# Patient Record
Sex: Female | Born: 1950 | Race: White | Hispanic: No | Smoking: Former smoker
Health system: Southern US, Community
[De-identification: ages and names within clinical notes are randomized; demographics above are authoritative.]

## PROBLEM LIST (undated history)

## (undated) DIAGNOSIS — M8580 Other specified disorders of bone density and structure, unspecified site: Secondary | ICD-10-CM

## (undated) DIAGNOSIS — G4730 Sleep apnea, unspecified: Secondary | ICD-10-CM

## (undated) DIAGNOSIS — E039 Hypothyroidism, unspecified: Secondary | ICD-10-CM

## (undated) DIAGNOSIS — Z862 Personal history of diseases of the blood and blood-forming organs and certain disorders involving the immune mechanism: Secondary | ICD-10-CM

## (undated) DIAGNOSIS — F419 Anxiety disorder, unspecified: Secondary | ICD-10-CM

## (undated) DIAGNOSIS — M1990 Unspecified osteoarthritis, unspecified site: Secondary | ICD-10-CM

## (undated) DIAGNOSIS — K5900 Constipation, unspecified: Secondary | ICD-10-CM

## (undated) DIAGNOSIS — Z87442 Personal history of urinary calculi: Secondary | ICD-10-CM

## (undated) DIAGNOSIS — Z85828 Personal history of other malignant neoplasm of skin: Secondary | ICD-10-CM

## (undated) DIAGNOSIS — K219 Gastro-esophageal reflux disease without esophagitis: Secondary | ICD-10-CM

## (undated) DIAGNOSIS — E7800 Pure hypercholesterolemia, unspecified: Secondary | ICD-10-CM

## (undated) DIAGNOSIS — I10 Essential (primary) hypertension: Secondary | ICD-10-CM

## (undated) HISTORY — PX: TONSILLECTOMY: SUR1361

## (undated) HISTORY — PX: CARPAL TUNNEL RELEASE: SHX101

---
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
## 1979-08-06 HISTORY — PX: TUBAL LIGATION: SHX77

## 1997-08-05 HISTORY — PX: MENISCUS REPAIR: SHX5179

## 1997-11-15 ENCOUNTER — Other Ambulatory Visit: Admission: RE | Admit: 1997-11-15 | Discharge: 1997-11-15 | Payer: Self-pay | Admitting: Obstetrics and Gynecology

## 1998-07-13 ENCOUNTER — Ambulatory Visit (HOSPITAL_BASED_OUTPATIENT_CLINIC_OR_DEPARTMENT_OTHER): Admission: RE | Admit: 1998-07-13 | Discharge: 1998-07-13 | Payer: Self-pay | Admitting: Orthopedic Surgery

## 1998-11-27 ENCOUNTER — Other Ambulatory Visit: Admission: RE | Admit: 1998-11-27 | Discharge: 1998-11-27 | Payer: Self-pay | Admitting: Obstetrics & Gynecology

## 1999-05-29 ENCOUNTER — Other Ambulatory Visit: Admission: RE | Admit: 1999-05-29 | Discharge: 1999-05-29 | Payer: Self-pay | Admitting: Obstetrics & Gynecology

## 1999-05-29 ENCOUNTER — Encounter (INDEPENDENT_AMBULATORY_CARE_PROVIDER_SITE_OTHER): Payer: Self-pay

## 1999-11-20 ENCOUNTER — Other Ambulatory Visit: Admission: RE | Admit: 1999-11-20 | Discharge: 1999-11-20 | Payer: Self-pay | Admitting: Obstetrics & Gynecology

## 1999-12-14 ENCOUNTER — Encounter: Admission: RE | Admit: 1999-12-14 | Discharge: 1999-12-14 | Payer: Self-pay | Admitting: Obstetrics & Gynecology

## 1999-12-14 ENCOUNTER — Encounter: Payer: Self-pay | Admitting: Obstetrics & Gynecology

## 2000-01-24 ENCOUNTER — Ambulatory Visit (HOSPITAL_COMMUNITY): Admission: RE | Admit: 2000-01-24 | Discharge: 2000-01-24 | Payer: Self-pay | Admitting: Family Medicine

## 2000-03-12 ENCOUNTER — Other Ambulatory Visit: Admission: RE | Admit: 2000-03-12 | Discharge: 2000-03-12 | Payer: Self-pay | Admitting: General Surgery

## 2000-11-21 ENCOUNTER — Other Ambulatory Visit: Admission: RE | Admit: 2000-11-21 | Discharge: 2000-11-21 | Payer: Self-pay | Admitting: Obstetrics & Gynecology

## 2000-12-23 ENCOUNTER — Encounter: Payer: Self-pay | Admitting: Obstetrics & Gynecology

## 2000-12-23 ENCOUNTER — Encounter: Admission: RE | Admit: 2000-12-23 | Discharge: 2000-12-23 | Payer: Self-pay | Admitting: Obstetrics & Gynecology

## 2001-11-30 ENCOUNTER — Other Ambulatory Visit: Admission: RE | Admit: 2001-11-30 | Discharge: 2001-11-30 | Payer: Self-pay | Admitting: Obstetrics & Gynecology

## 2002-03-26 ENCOUNTER — Encounter: Payer: Self-pay | Admitting: Orthopedic Surgery

## 2002-03-26 ENCOUNTER — Encounter: Admission: RE | Admit: 2002-03-26 | Discharge: 2002-03-26 | Payer: Self-pay | Admitting: Orthopedic Surgery

## 2002-05-08 ENCOUNTER — Encounter: Admission: RE | Admit: 2002-05-08 | Discharge: 2002-05-08 | Payer: Self-pay | Admitting: Orthopedic Surgery

## 2002-05-08 ENCOUNTER — Encounter: Payer: Self-pay | Admitting: Orthopedic Surgery

## 2003-02-09 ENCOUNTER — Other Ambulatory Visit: Admission: RE | Admit: 2003-02-09 | Discharge: 2003-02-09 | Payer: Self-pay | Admitting: Obstetrics & Gynecology

## 2003-04-15 ENCOUNTER — Ambulatory Visit (HOSPITAL_COMMUNITY): Admission: RE | Admit: 2003-04-15 | Discharge: 2003-04-15 | Payer: Self-pay | Admitting: Orthopedic Surgery

## 2003-04-15 ENCOUNTER — Encounter: Payer: Self-pay | Admitting: Orthopedic Surgery

## 2005-04-15 ENCOUNTER — Encounter (INDEPENDENT_AMBULATORY_CARE_PROVIDER_SITE_OTHER): Payer: Self-pay | Admitting: Specialist

## 2005-04-15 ENCOUNTER — Ambulatory Visit (HOSPITAL_COMMUNITY): Admission: RE | Admit: 2005-04-15 | Discharge: 2005-04-15 | Payer: Self-pay | Admitting: Gastroenterology

## 2006-01-30 ENCOUNTER — Encounter: Admission: RE | Admit: 2006-01-30 | Discharge: 2006-01-30 | Payer: Self-pay | Admitting: Family Medicine

## 2007-07-22 ENCOUNTER — Ambulatory Visit (HOSPITAL_COMMUNITY): Admission: RE | Admit: 2007-07-22 | Discharge: 2007-07-22 | Payer: Self-pay | Admitting: Obstetrics & Gynecology

## 2008-08-17 ENCOUNTER — Ambulatory Visit (HOSPITAL_COMMUNITY): Admission: RE | Admit: 2008-08-17 | Discharge: 2008-08-17 | Payer: Self-pay | Admitting: Obstetrics & Gynecology

## 2009-08-05 HISTORY — PX: BUNIONECTOMY: SHX129

## 2009-08-18 ENCOUNTER — Ambulatory Visit (HOSPITAL_COMMUNITY): Admission: RE | Admit: 2009-08-18 | Discharge: 2009-08-18 | Payer: Self-pay | Admitting: Obstetrics & Gynecology

## 2009-08-23 ENCOUNTER — Encounter: Admission: RE | Admit: 2009-08-23 | Discharge: 2009-08-23 | Payer: Self-pay | Admitting: Obstetrics & Gynecology

## 2009-11-17 ENCOUNTER — Encounter: Admission: RE | Admit: 2009-11-17 | Discharge: 2009-11-17 | Payer: Self-pay | Admitting: Obstetrics & Gynecology

## 2010-11-20 ENCOUNTER — Other Ambulatory Visit: Payer: Self-pay | Admitting: Family Medicine

## 2010-11-20 DIAGNOSIS — Z1231 Encounter for screening mammogram for malignant neoplasm of breast: Secondary | ICD-10-CM

## 2010-11-27 ENCOUNTER — Ambulatory Visit
Admission: RE | Admit: 2010-11-27 | Discharge: 2010-11-27 | Disposition: A | Discharge status: Home / Self Care | Payer: BC Managed Care – PPO | Source: Ambulatory Visit | Attending: Family Medicine | Admitting: Family Medicine

## 2010-11-27 DIAGNOSIS — Z1231 Encounter for screening mammogram for malignant neoplasm of breast: Secondary | ICD-10-CM

## 2010-12-14 ENCOUNTER — Other Ambulatory Visit: Payer: Self-pay | Admitting: Obstetrics & Gynecology

## 2010-12-21 NOTE — Procedures (Signed)
[place]  Patient:    Jamie Espinoza, Jamie Espinoza                         MRN: [id number] Proc. Date: 01/24/00 Adm. Date:  [id number] Disc. Date: [id number] Attending:  Charna Elizabeth CC:         Talmadge Coventry, M.D.                           Procedure Report  DATE OF BIRTH:  02-07-1951  REFERRING PHYSICIAN:  Talmadge Coventry, M.D.  PROCEDURE:  Colonoscopy.  ENDOSCOPIST:  Anselmo Rod, M.D.  INSTRUMENTS USED:  Olympus video colonoscope.  INDICATIONS:  Rectal bleeding with personal history of polyps and a family history of colon cancer in a 60 year old white female.  Rule out colonic polyps, masses, hemorrhoids, etc.  PREPROCEDURE PREPARATION:  Informed consent was procured from the patient. The patient was fasted for eight hours prior to the procedure and prepped with a bottle of magnesium citrate and a gallon of NuLytely the night prior to the procedure.  PREPROCEDURE PHYSICAL EXAMINATION:  VITAL SIGNS:  The patient had stable vital signs.  NECK:  Supple.  CHEST:  Clear to auscultation, S1, S2 regular.  ABDOMEN:  Soft with normal abdominal bowel sounds.  DESCRIPTION OF PROCEDURE:  The patient was placed in the left lateral decubitus position and sedated with 80 mg of Demerol and 2 mg of Versed intravenously.  Once the patient was adequately sedated and maintained on low-flow oxygen and continuous cardiac monitoring, the Olympus video colonoscope was advanced from the rectum to the cecum without difficulty. Except for some residual stool in the dependent areas of the colon, no abnormalities were seen up to the cecum.  The appendicial orifice and the ileocecal valve were clearly visualized.  No masses, polyps, erosions, ulcerations, or diverticular were present.  There were prominent external hemorrhoids and small internal hemorrhoids that were nonbleeding at the time of examination.  The patient tolerated the procedure well  without complications.  No masses or polyps were seen.  IMPRESSION:  Essentially normal colonoscopy except for a prominent external hemorrhoids and small internal hemorrhoid.  RECOMMENDATIONS: 1. The patient has been advised to increase her fluids and fiber in her diet. 2. If she has recurrent rectal bleeding she may benefit from a surgical    evaluation and possible surgery for the external hemorrhoids. DD:  01/24/00 TD:  01/26/00 Job: [id number]

## 2010-12-21 NOTE — Op Note (Signed)
NAME:  Jamie Espinoza, Jamie Espinoza NO.:  [id number]   MEDICAL RECORD NO.:  [id number]          PATIENT TYPE:  AMB   LOCATION:  ENDO                         FACILITY:  [facility]   PHYSICIAN:  Anselmo Rod, M.D.  DATE OF BIRTH:  June 26, 1951   DATE OF PROCEDURE:  04/15/2005  DATE OF DISCHARGE:                                 OPERATIVE REPORT   PROCEDURE PERFORMED:  Colonoscopy with snare polypectomy x two.   ENDOSCOPIST:  Charna Elizabeth, M.D.   INSTRUMENT USED:  Olympus video colonoscope.   INDICATIONS FOR PROCEDURE:  60 year old white female with a history of  rectal bleeding and a family history of colon cancer in her father,  undergoing screening colonoscopy to rule out colonic polyps, masses, etc.   PREPROCEDURE PREPARATION:  Informed consent was procured from the patient.  The patient was fasted for eight hours prior to the procedure and prepped  with a bottle of magnesium citrate and a gallon of GoLYTELY the night prior  to the procedure.  The risks and benefits of the procedure including a 10%  miss rate for colon polyps or cancers was discussed with the patient as  well.   PREPROCEDURE PHYSICAL:  The patient had stable vital signs.  Neck supple.  Chest clear to auscultation.  S1 and S2 regular.  Abdomen soft with normal  bowel sounds.   DESCRIPTION OF PROCEDURE:  The patient was placed in left lateral decubitus  position and sedated with 100 mg of Demerol and 10 mg of Versed in slow  incremental doses.  Once the patient was adequately sedated and maintained  on low flow oxygen and continuous cardiac monitoring, the Olympus video  colonoscope was advanced from the rectum to the cecum.  The appendicular  orifice and ileocecal valve were clearly visualized and photographed.  A few  scattered sigmoid diverticula were seen.  Small internal hemorrhoids were  appreciated on retroflexion.  Two small sessile polyps were removed by snare  polypectomy (hot snare one from 75 cm and  one from the distal right colon).  The patient tolerated the procedure well without complication.   IMPRESSION:  1.  Small nonbleeding internal hemorrhoids.  2.  Small sessile polyp snared from 75 cm.  Another small sessile polyp      removed from the distal right colon.  3.  Few scattered sigmoid diverticula.   RECOMMENDATIONS:  1.  Await pathology results.  2.  Avoid all nonsteroidals including aspirin for the next two weeks.  3.  Repeat colonoscopy depending on pathology results.  4.  Outpatient followup as need arises in the future.      Anselmo Rod, M.D.  Electronically Signed     JNM/MEDQ  D:  04/15/2005  T:  04/15/2005  Job:  [id number]   cc:   Talmadge Coventry, M.D.  [address]  [place]  [place] [address]  Fax: [phone]

## 2011-10-22 ENCOUNTER — Other Ambulatory Visit: Payer: Self-pay | Admitting: Family Medicine

## 2011-10-22 DIAGNOSIS — Z1231 Encounter for screening mammogram for malignant neoplasm of breast: Secondary | ICD-10-CM

## 2011-11-29 ENCOUNTER — Ambulatory Visit
Admission: RE | Admit: 2011-11-29 | Discharge: 2011-11-29 | Disposition: A | Discharge status: Home / Self Care | Payer: BC Managed Care – PPO | Source: Ambulatory Visit | Attending: Family Medicine | Admitting: Family Medicine

## 2011-11-29 DIAGNOSIS — Z1231 Encounter for screening mammogram for malignant neoplasm of breast: Secondary | ICD-10-CM

## 2012-11-02 ENCOUNTER — Other Ambulatory Visit: Payer: Self-pay

## 2012-11-02 DIAGNOSIS — Z1231 Encounter for screening mammogram for malignant neoplasm of breast: Secondary | ICD-10-CM

## 2012-12-15 ENCOUNTER — Ambulatory Visit: Payer: BC Managed Care – PPO

## 2012-12-23 ENCOUNTER — Ambulatory Visit
Admission: RE | Admit: 2012-12-23 | Discharge: 2012-12-23 | Disposition: A | Discharge status: Home / Self Care | Payer: BC Managed Care – PPO | Source: Ambulatory Visit

## 2012-12-23 DIAGNOSIS — Z1231 Encounter for screening mammogram for malignant neoplasm of breast: Secondary | ICD-10-CM

## 2013-03-23 ENCOUNTER — Other Ambulatory Visit: Payer: Self-pay | Admitting: Orthopaedic Surgery

## 2013-03-25 NOTE — Pre-Procedure Instructions (Signed)
FELICITE ZEIMET  03/25/2013   Your procedure is scheduled on:  Tuesday April 06, 2013.  Report to [facility] at 5:30 AM.  Call this number if you have problems the morning of surgery: [phone]   Remember:   Do not eat food or drink liquids after midnight.   Take these medicines the morning of surgery with A SIP OF WATER: Synthroid, Wellbutrin, Prilosec, Mucinex (if needed)   STOP Lovaza, Celebrex, Vitamin D, Calcium, Vitamin E, D3, B, CCoQ10, Multiple Vitamin, Asprin  Do not take Aspirin, Aleve, Naproxen, Advil, Ibuprofen, Vitamin, Herbs, or Supplements starting today   Do not wear jewelry  Do not wear lotions, powders, or colognes.  Do not shave 2 days prior to surgery.  Do not bring valuables to the hospital.  [facility] is not responsible for any belongings or valuables.  Contacts, dentures or bridgework may not be worn into surgery.  Leave suitcase in the car. After surgery it may be brought to your room.  For patients admitted to the hospital, checkout time is 11:00 AM the day of discharge.   Patients discharged the day of surgery will not be allowed to drive home.  Name and phone number of your driver: Family/Friend  Special Instructions: Shower using CHG 2 nights before surgery and the night before surgery.  If you shower the day of surgery use CHG.  Use special wash - you have one bottle of CHG for all showers.  You should use approximately 1/3 of the bottle for each shower.   Please read over the following fact sheets that you were given: Pain Booklet, Coughing and Deep Breathing, Blood Transfusion Information, Total Joint Packet, MRSA Information and Surgical Site Infection Prevention

## 2013-03-26 ENCOUNTER — Other Ambulatory Visit (HOSPITAL_COMMUNITY): Payer: BC Managed Care – PPO

## 2013-03-26 ENCOUNTER — Encounter (HOSPITAL_COMMUNITY)
Admission: RE | Admit: 2013-03-26 | Discharge: 2013-03-26 | Disposition: A | Discharge status: Home / Self Care | Payer: BC Managed Care – PPO | Source: Ambulatory Visit | Attending: Orthopaedic Surgery | Admitting: Orthopaedic Surgery

## 2013-03-26 ENCOUNTER — Encounter (HOSPITAL_COMMUNITY): Payer: Self-pay

## 2013-03-26 ENCOUNTER — Ambulatory Visit (HOSPITAL_COMMUNITY)
Admission: RE | Admit: 2013-03-26 | Discharge: 2013-03-26 | Disposition: A | Discharge status: Home / Self Care | Payer: BC Managed Care – PPO | Source: Ambulatory Visit | Attending: Orthopaedic Surgery | Admitting: Orthopaedic Surgery

## 2013-03-26 DIAGNOSIS — Z01812 Encounter for preprocedural laboratory examination: Secondary | ICD-10-CM | POA: Insufficient documentation

## 2013-03-26 DIAGNOSIS — Z01818 Encounter for other preprocedural examination: Secondary | ICD-10-CM | POA: Insufficient documentation

## 2013-03-26 DIAGNOSIS — I10 Essential (primary) hypertension: Secondary | ICD-10-CM | POA: Insufficient documentation

## 2013-03-26 HISTORY — DX: Sleep apnea, unspecified: G47.30

## 2013-03-26 HISTORY — DX: Unspecified osteoarthritis, unspecified site: M19.90

## 2013-03-26 HISTORY — DX: Pure hypercholesterolemia, unspecified: E78.00

## 2013-03-26 HISTORY — DX: Gastro-esophageal reflux disease without esophagitis: K21.9

## 2013-03-26 HISTORY — DX: Anxiety disorder, unspecified: F41.9

## 2013-03-26 HISTORY — DX: Essential (primary) hypertension: I10

## 2013-03-26 HISTORY — DX: Hypothyroidism, unspecified: E03.9

## 2013-03-26 HISTORY — DX: Personal history of diseases of the blood and blood-forming organs and certain disorders involving the immune mechanism: Z86.2

## 2013-03-26 LAB — BASIC METABOLIC PANEL
BUN: 13 mg/dL (ref 6–23)
CO2: 31 mEq/L (ref 19–32)
Chloride: 103 mEq/L (ref 96–112)
Glucose, Bld: 93 mg/dL (ref 70–99)
Potassium: 4 mEq/L (ref 3.5–5.1)
Sodium: 142 mEq/L (ref 135–145)

## 2013-03-26 LAB — CBC WITH DIFFERENTIAL/PLATELET
Eosinophils Absolute: 0.2 10*3/uL (ref 0.0–0.7)
HCT: 38.6 % (ref 36.0–46.0)
Hemoglobin: 13.6 g/dL (ref 12.0–15.0)
Lymphs Abs: 2 10*3/uL (ref 0.7–4.0)
MCH: 30.4 pg (ref 26.0–34.0)
MCV: 86.4 fL (ref 78.0–100.0)
Monocytes Absolute: 0.6 10*3/uL (ref 0.1–1.0)
Monocytes Relative: 10 % (ref 3–12)
Neutrophils Relative %: 55 % (ref 43–77)
RBC: 4.47 MIL/uL (ref 3.87–5.11)

## 2013-03-26 LAB — URINALYSIS, ROUTINE W REFLEX MICROSCOPIC
Ketones, ur: NEGATIVE mg/dL
Nitrite: NEGATIVE
Protein, ur: NEGATIVE mg/dL
pH: 7.5 (ref 5.0–8.0)

## 2013-03-26 LAB — ABO/RH: ABO/RH(D): O POS

## 2013-03-26 LAB — PROTIME-INR: INR: 0.94 (ref 0.00–1.49)

## 2013-03-26 LAB — TYPE AND SCREEN

## 2013-04-02 NOTE — H&P (Signed)
TOTAL KNEE ADMISSION H&P  Patient is being admitted for right total knee arthroplasty.  Subjective:  Chief Complaint:right knee pain.  HPI: Jamie Espinoza, 62 y.o. female, has a history of pain and functional disability in the right knee due to arthritis and has failed non-surgical conservative treatments for greater than 12 weeks to includeNSAID's and/or analgesics, corticosteriod injections, weight reduction as appropriate and activity modification.  Onset of symptoms was gradual, starting 7 years ago with gradually worsening course since that time. The patient noted previous open procedure 1999 on the right knee(s).  Patient currently rates pain in the right knee(s) at 9 out of 10 with activity. Patient has night pain, worsening of pain with activity and weight bearing, pain that interferes with activities of daily living, pain with passive range of motion, crepitus and joint swelling.  Patient has evidence of subchondral sclerosis, periarticular osteophytes and joint space narrowing by imaging studies. This patient has had previous open procedure.. There is no active infection.  There are no active problems to display for this patient.  Past Medical History  Diagnosis Date  . Sleep apnea     uses CPAP  . Hypothyroidism   . Hypertension     Does not see cardiologist  . High cholesterol   . GERD (gastroesophageal reflux disease)   . Anxiety   . Carpal tunnel syndrome   . History of anemia     d/t heavy menstrual bleeding  . DJD (degenerative joint disease)     Past Surgical History  Procedure Laterality Date  . Carpal tunnel release Bilateral     2014  . Bunionectomy  2011    with toe fusion and screw  . Meniscus repair Right 1999  . Right ankle surgery  1969  . Tubal ligation  1981  . Tonsillectomy      as a child    No prescriptions prior to admission   No Known Allergies  History  Substance Use Topics  . Smoking status: Former Smoker -- 1.00 packs/day for 20 years   Quit date: 08/05/1990  . Smokeless tobacco: Not on file  . Alcohol Use: Yes     Comment: glass of wine or beer occasionally    No family history on file.   Review of Systems  Constitutional: Negative.   HENT: Negative.   Eyes: Negative.   Respiratory: Negative.   Cardiovascular: Negative.   Gastrointestinal: Negative.   Genitourinary: Negative.   Musculoskeletal: Positive for joint pain.  Skin: Negative.   Neurological: Negative.   Endo/Heme/Allergies: Negative.   Psychiatric/Behavioral: Negative.     Objective:  Physical Exam  Constitutional: She is oriented to person, place, and time. She appears well-nourished.  HENT:  Head: Atraumatic.  Eyes: EOM are normal.  Neck: Neck supple.  Cardiovascular: Regular rhythm.   Respiratory: Breath sounds normal.  GI: Bowel sounds are normal.  Musculoskeletal:  Right knee exam: Range of motion is 5-1 15.   No significant fluid.  Crepitation 1+.  Pain along the medial joint line and patellofemoral area.  Sensory and motor function normal.  Neurological: She is oriented to person, place, and time.  Skin: Skin is dry.  Psychiatric: She has a normal mood and affect.    Vital signs in last 24 hours:    Labs:   There is no height or weight on file to calculate BMI.   Imaging Review Plain radiographs demonstrate severe degenerative joint disease of the right knee(s). The overall alignment isneutral. The bone quality appears  to be good for age and reported activity level.  Assessment/Plan:  End stage arthritis, right knee   The patient history, physical examination, clinical judgment of the provider and imaging studies are consistent with end stage degenerative joint disease of the right knee(s) and total knee arthroplasty is deemed medically necessary. The treatment options including medical management, injection therapy arthroscopy and arthroplasty were discussed at length. The risks and benefits of total knee arthroplasty were  presented and reviewed. The risks due to aseptic loosening, infection, stiffness, patella tracking problems, thromboembolic complications and other imponderables were discussed. The patient acknowledged the explanation, agreed to proceed with the plan and consent was signed. Patient is being admitted for inpatient treatment for surgery, pain control, PT, OT, prophylactic antibiotics, VTE prophylaxis, progressive ambulation and ADL's and discharge planning. The patient is planning to be discharged home with home health services

## 2013-04-05 MED ORDER — CEFAZOLIN SODIUM-DEXTROSE 2-3 GM-% IV SOLR
2.0000 g | INTRAVENOUS | Status: AC
  Administered 2013-04-06: 2 g via INTRAVENOUS
  Filled 2013-04-05: qty 50

## 2013-04-06 ENCOUNTER — Inpatient Hospital Stay (HOSPITAL_COMMUNITY)
Admission: RE | Admit: 2013-04-06 | Discharge: 2013-04-08 | DRG: 209 | Disposition: A | Discharge status: Home Health | Payer: BC Managed Care – PPO | Source: Ambulatory Visit | Attending: Orthopaedic Surgery | Admitting: Orthopaedic Surgery

## 2013-04-06 ENCOUNTER — Encounter (HOSPITAL_COMMUNITY): Payer: Self-pay | Admitting: Anesthesiology

## 2013-04-06 ENCOUNTER — Inpatient Hospital Stay (HOSPITAL_COMMUNITY): Payer: BC Managed Care – PPO | Admitting: Anesthesiology

## 2013-04-06 ENCOUNTER — Encounter (HOSPITAL_COMMUNITY)
Admission: RE | Disposition: A | Discharge status: Home Health | Payer: Self-pay | Source: Ambulatory Visit | Attending: Orthopaedic Surgery

## 2013-04-06 DIAGNOSIS — Z7982 Long term (current) use of aspirin: Secondary | ICD-10-CM

## 2013-04-06 DIAGNOSIS — Z87891 Personal history of nicotine dependence: Secondary | ICD-10-CM

## 2013-04-06 DIAGNOSIS — I10 Essential (primary) hypertension: Secondary | ICD-10-CM | POA: Diagnosis present

## 2013-04-06 DIAGNOSIS — F411 Generalized anxiety disorder: Secondary | ICD-10-CM | POA: Diagnosis present

## 2013-04-06 DIAGNOSIS — K219 Gastro-esophageal reflux disease without esophagitis: Secondary | ICD-10-CM | POA: Diagnosis present

## 2013-04-06 DIAGNOSIS — Z9851 Tubal ligation status: Secondary | ICD-10-CM

## 2013-04-06 DIAGNOSIS — M1711 Unilateral primary osteoarthritis, right knee: Secondary | ICD-10-CM | POA: Diagnosis present

## 2013-04-06 DIAGNOSIS — E039 Hypothyroidism, unspecified: Secondary | ICD-10-CM | POA: Diagnosis present

## 2013-04-06 DIAGNOSIS — E7800 Pure hypercholesterolemia, unspecified: Secondary | ICD-10-CM | POA: Diagnosis present

## 2013-04-06 DIAGNOSIS — M1710 Unilateral primary osteoarthritis, unspecified knee: Principal | ICD-10-CM | POA: Diagnosis present

## 2013-04-06 DIAGNOSIS — Z79899 Other long term (current) drug therapy: Secondary | ICD-10-CM

## 2013-04-06 DIAGNOSIS — G4730 Sleep apnea, unspecified: Secondary | ICD-10-CM | POA: Diagnosis present

## 2013-04-06 HISTORY — PX: TOTAL KNEE ARTHROPLASTY: SHX125

## 2013-04-06 SURGERY — ARTHROPLASTY, KNEE, TOTAL
Anesthesia: Spinal | Site: Knee | Laterality: Right | Wound class: Clean

## 2013-04-06 MED ORDER — BUPROPION HCL ER (XL) 300 MG PO TB24
300.0000 mg | ORAL_TABLET | Freq: Every day | ORAL | Status: DC
  Administered 2013-04-08: 300 mg via ORAL
  Filled 2013-04-06 (×3): qty 1

## 2013-04-06 MED ORDER — LEVOTHYROXINE SODIUM 75 MCG PO TABS
75.0000 ug | ORAL_TABLET | Freq: Every day | ORAL | Status: DC
  Administered 2013-04-06 – 2013-04-07 (×2): 75 ug via ORAL
  Filled 2013-04-06 (×3): qty 1

## 2013-04-06 MED ORDER — LACTATED RINGERS IV SOLN: INTRAVENOUS | Status: DC

## 2013-04-06 MED ORDER — LOSARTAN POTASSIUM 50 MG PO TABS
50.0000 mg | ORAL_TABLET | Freq: Every day | ORAL | Status: DC
  Administered 2013-04-06 – 2013-04-07 (×2): 50 mg via ORAL
  Filled 2013-04-06 (×3): qty 1

## 2013-04-06 MED ORDER — CHLORHEXIDINE GLUCONATE 4 % EX LIQD: 60.0000 mL | Freq: Once | CUTANEOUS | Status: DC

## 2013-04-06 MED ORDER — ADULT MULTIVITAMIN W/MINERALS CH
1.0000 | ORAL_TABLET | Freq: Every day | ORAL | Status: DC
  Administered 2013-04-07 – 2013-04-08 (×2): 1 via ORAL
  Filled 2013-04-06 (×2): qty 1

## 2013-04-06 MED ORDER — DIPHENHYDRAMINE HCL 12.5 MG/5ML PO ELIX: 12.5000 mg | ORAL_SOLUTION | ORAL | Status: DC | PRN

## 2013-04-06 MED ORDER — METOCLOPRAMIDE HCL 5 MG PO TABS: 5.0000 mg | ORAL_TABLET | Freq: Three times a day (TID) | ORAL | Status: DC | PRN

## 2013-04-06 MED ORDER — LACTATED RINGERS IV SOLN
INTRAVENOUS | Status: DC
  Administered 2013-04-07: 06:00:00 via INTRAVENOUS

## 2013-04-06 MED ORDER — FENTANYL CITRATE 0.05 MG/ML IJ SOLN
INTRAMUSCULAR | Status: DC | PRN
  Administered 2013-04-06 (×2): 50 ug via INTRAVENOUS
  Administered 2013-04-06: 25 ug via INTRAVENOUS

## 2013-04-06 MED ORDER — LOSARTAN POTASSIUM-HCTZ 50-12.5 MG PO TABS: 1.0000 | ORAL_TABLET | Freq: Every day | ORAL | Status: DC

## 2013-04-06 MED ORDER — METOCLOPRAMIDE HCL 5 MG/ML IJ SOLN: 5.0000 mg | Freq: Three times a day (TID) | INTRAMUSCULAR | Status: DC | PRN

## 2013-04-06 MED ORDER — OXYCODONE HCL 5 MG/5ML PO SOLN: 5.0000 mg | Freq: Once | ORAL | Status: DC | PRN

## 2013-04-06 MED ORDER — PROPOFOL 10 MG/ML IV BOLUS
INTRAVENOUS | Status: DC | PRN
  Administered 2013-04-06: 30 mg via INTRAVENOUS

## 2013-04-06 MED ORDER — ONDANSETRON HCL 4 MG/2ML IJ SOLN: 4.0000 mg | Freq: Four times a day (QID) | INTRAMUSCULAR | Status: DC | PRN

## 2013-04-06 MED ORDER — ONDANSETRON HCL 4 MG/2ML IJ SOLN
INTRAMUSCULAR | Status: DC | PRN
  Administered 2013-04-06: 4 mg via INTRAVENOUS

## 2013-04-06 MED ORDER — PANTOPRAZOLE SODIUM 40 MG PO TBEC
40.0000 mg | DELAYED_RELEASE_TABLET | Freq: Every day | ORAL | Status: DC
  Administered 2013-04-07 – 2013-04-08 (×2): 40 mg via ORAL
  Filled 2013-04-06 (×3): qty 1

## 2013-04-06 MED ORDER — ACETAMINOPHEN 325 MG PO TABS
650.0000 mg | ORAL_TABLET | Freq: Four times a day (QID) | ORAL | Status: DC | PRN
  Administered 2013-04-07: 650 mg via ORAL
  Filled 2013-04-06: qty 2

## 2013-04-06 MED ORDER — HYDROMORPHONE HCL PF 1 MG/ML IJ SOLN
0.5000 mg | INTRAMUSCULAR | Status: DC | PRN
  Administered 2013-04-08: 1 mg via INTRAVENOUS
  Filled 2013-04-06: qty 1

## 2013-04-06 MED ORDER — HYDROMORPHONE HCL PF 1 MG/ML IJ SOLN: 0.2500 mg | INTRAMUSCULAR | Status: DC | PRN

## 2013-04-06 MED ORDER — SODIUM CHLORIDE 0.9 % IR SOLN
Status: DC | PRN
  Administered 2013-04-06: 3000 mL

## 2013-04-06 MED ORDER — METHOCARBAMOL 500 MG PO TABS
500.0000 mg | ORAL_TABLET | Freq: Four times a day (QID) | ORAL | Status: DC | PRN
  Administered 2013-04-06 – 2013-04-08 (×7): 500 mg via ORAL
  Filled 2013-04-06 (×8): qty 1

## 2013-04-06 MED ORDER — LACTATED RINGERS IV SOLN
INTRAVENOUS | Status: DC | PRN
  Administered 2013-04-06 (×2): via INTRAVENOUS

## 2013-04-06 MED ORDER — TRANEXAMIC ACID 100 MG/ML IV SOLN
1000.0000 mg | INTRAVENOUS | Status: AC
  Administered 2013-04-06: 1000 mg via INTRAVENOUS
  Filled 2013-04-06: qty 10

## 2013-04-06 MED ORDER — ACETAMINOPHEN 650 MG RE SUPP: 650.0000 mg | Freq: Four times a day (QID) | RECTAL | Status: DC | PRN

## 2013-04-06 MED ORDER — MENTHOL 3 MG MT LOZG: 1.0000 | LOZENGE | OROMUCOSAL | Status: DC | PRN

## 2013-04-06 MED ORDER — SENNOSIDES 8.6 MG PO TABS
1.0000 | ORAL_TABLET | Freq: Every evening | ORAL | Status: DC | PRN
  Filled 2013-04-06: qty 1

## 2013-04-06 MED ORDER — HYDROCHLOROTHIAZIDE 12.5 MG PO CAPS
12.5000 mg | ORAL_CAPSULE | Freq: Every day | ORAL | Status: DC
  Administered 2013-04-06 – 2013-04-07 (×2): 12.5 mg via ORAL
  Filled 2013-04-06 (×3): qty 1

## 2013-04-06 MED ORDER — ASPIRIN EC 325 MG PO TBEC
325.0000 mg | DELAYED_RELEASE_TABLET | Freq: Two times a day (BID) | ORAL | Status: DC
  Administered 2013-04-07 – 2013-04-08 (×3): 325 mg via ORAL
  Filled 2013-04-06 (×6): qty 1

## 2013-04-06 MED ORDER — NIACIN-SIMVASTATIN ER 1000-20 MG PO TB24: 1.0000 | ORAL_TABLET | Freq: Every day | ORAL | Status: DC

## 2013-04-06 MED ORDER — ONDANSETRON HCL 4 MG PO TABS: 4.0000 mg | ORAL_TABLET | Freq: Four times a day (QID) | ORAL | Status: DC | PRN

## 2013-04-06 MED ORDER — 0.9 % SODIUM CHLORIDE (POUR BTL) OPTIME
TOPICAL | Status: DC | PRN
  Administered 2013-04-06: 1000 mL

## 2013-04-06 MED ORDER — BUPIVACAINE-EPINEPHRINE PF 0.5-1:200000 % IJ SOLN
INTRAMUSCULAR | Status: DC | PRN
  Administered 2013-04-06: 25 mL

## 2013-04-06 MED ORDER — DEXTROSE 5 % IV SOLN
INTRAVENOUS | Status: DC | PRN
  Administered 2013-04-06: 08:00:00 via INTRAVENOUS

## 2013-04-06 MED ORDER — OMEGA-3-ACID ETHYL ESTERS 1 G PO CAPS
2.0000 g | ORAL_CAPSULE | Freq: Two times a day (BID) | ORAL | Status: DC
  Administered 2013-04-06 – 2013-04-08 (×3): 2 g via ORAL
  Filled 2013-04-06 (×5): qty 2

## 2013-04-06 MED ORDER — BUPIVACAINE HCL (PF) 0.75 % IJ SOLN
INTRAMUSCULAR | Status: DC | PRN
  Administered 2013-04-06: .6 mL via INTRATHECAL

## 2013-04-06 MED ORDER — SODIUM CHLORIDE 0.9 % IV SOLN
INTRAVENOUS | Status: DC | PRN
  Administered 2013-04-06: 09:00:00 via INTRAVENOUS

## 2013-04-06 MED ORDER — ZOLPIDEM TARTRATE 5 MG PO TABS: 5.0000 mg | ORAL_TABLET | Freq: Every evening | ORAL | Status: DC | PRN

## 2013-04-06 MED ORDER — PROMETHAZINE HCL 25 MG/ML IJ SOLN: 6.2500 mg | INTRAMUSCULAR | Status: DC | PRN

## 2013-04-06 MED ORDER — HYDROCODONE-ACETAMINOPHEN 7.5-325 MG PO TABS
1.0000 | ORAL_TABLET | ORAL | Status: DC | PRN
  Administered 2013-04-06 (×3): 2 via ORAL
  Administered 2013-04-07: 1 via ORAL
  Administered 2013-04-07 (×2): 2 via ORAL
  Administered 2013-04-07 (×2): 1 via ORAL
  Administered 2013-04-08: 2 via ORAL
  Administered 2013-04-08: 1 via ORAL
  Administered 2013-04-08: 2 via ORAL
  Filled 2013-04-06: qty 2
  Filled 2013-04-06 (×2): qty 1
  Filled 2013-04-06: qty 2
  Filled 2013-04-06: qty 1
  Filled 2013-04-06 (×2): qty 2
  Filled 2013-04-06: qty 1
  Filled 2013-04-06: qty 2
  Filled 2013-04-06 (×2): qty 1
  Filled 2013-04-06 (×2): qty 2

## 2013-04-06 MED ORDER — METHOCARBAMOL 100 MG/ML IJ SOLN: 500.0000 mg | Freq: Four times a day (QID) | INTRAVENOUS | Status: DC | PRN

## 2013-04-06 MED ORDER — PHENOL 1.4 % MT LIQD: 1.0000 | OROMUCOSAL | Status: DC | PRN

## 2013-04-06 MED ORDER — OXYCODONE HCL 5 MG PO TABS: 5.0000 mg | ORAL_TABLET | Freq: Once | ORAL | Status: DC | PRN

## 2013-04-06 MED ORDER — ALUM & MAG HYDROXIDE-SIMETH 200-200-20 MG/5ML PO SUSP: 30.0000 mL | ORAL | Status: DC | PRN

## 2013-04-06 MED ORDER — PROPOFOL INFUSION 10 MG/ML OPTIME
INTRAVENOUS | Status: DC | PRN
  Administered 2013-04-06: 25 ug/kg/min via INTRAVENOUS

## 2013-04-06 MED ORDER — NIACIN ER 500 MG PO CPCR
1000.0000 mg | ORAL_CAPSULE | Freq: Every day | ORAL | Status: DC
  Administered 2013-04-07: 1000 mg via ORAL
  Filled 2013-04-06 (×3): qty 2

## 2013-04-06 MED ORDER — TRANEXAMIC ACID (OHS) BOLUS VIA INFUSION: 15.0000 mg/kg | INTRAVENOUS | Status: DC

## 2013-04-06 MED ORDER — MIDAZOLAM HCL 2 MG/2ML IJ SOLN: 1.0000 mg | INTRAMUSCULAR | Status: DC | PRN

## 2013-04-06 MED ORDER — CALCIUM-MAGNESIUM-ZINC 333.33-133.33-5 MG PO TABS: 1.0000 | ORAL_TABLET | Freq: Two times a day (BID) | ORAL | Status: DC

## 2013-04-06 MED ORDER — FENTANYL CITRATE 0.05 MG/ML IJ SOLN: 50.0000 ug | Freq: Once | INTRAMUSCULAR | Status: DC

## 2013-04-06 MED ORDER — VITAMIN D3 25 MCG (1000 UNIT) PO TABS
2000.0000 [IU] | ORAL_TABLET | Freq: Every day | ORAL | Status: DC
  Administered 2013-04-07 – 2013-04-08 (×2): 2000 [IU] via ORAL
  Filled 2013-04-06 (×2): qty 2

## 2013-04-06 MED ORDER — VITAMIN D 50 MCG (2000 UT) PO CAPS: 2000.0000 [IU] | ORAL_CAPSULE | Freq: Every day | ORAL | Status: DC

## 2013-04-06 MED ORDER — FERROUS SULFATE 325 (65 FE) MG PO TABS
325.0000 mg | ORAL_TABLET | Freq: Every day | ORAL | Status: DC
  Administered 2013-04-07 – 2013-04-08 (×2): 325 mg via ORAL
  Filled 2013-04-06 (×3): qty 1

## 2013-04-06 MED ORDER — CEFAZOLIN SODIUM-DEXTROSE 2-3 GM-% IV SOLR
2.0000 g | Freq: Four times a day (QID) | INTRAVENOUS | Status: AC
  Administered 2013-04-06 (×2): 2 g via INTRAVENOUS
  Filled 2013-04-06 (×2): qty 50

## 2013-04-06 MED ORDER — MIDAZOLAM HCL 5 MG/5ML IJ SOLN
INTRAMUSCULAR | Status: DC | PRN
  Administered 2013-04-06 (×2): 1 mg via INTRAVENOUS

## 2013-04-06 MED ORDER — SIMVASTATIN 20 MG PO TABS
20.0000 mg | ORAL_TABLET | Freq: Every day | ORAL | Status: DC
  Administered 2013-04-06 – 2013-04-07 (×2): 20 mg via ORAL
  Filled 2013-04-06 (×4): qty 1

## 2013-04-06 MED ORDER — DEXAMETHASONE SODIUM PHOSPHATE 4 MG/ML IJ SOLN
INTRAMUSCULAR | Status: DC | PRN
  Administered 2013-04-06: 4 mg

## 2013-04-06 SURGICAL SUPPLY — 66 items
APL SKNCLS STERI-STRIP NONHPOA (GAUZE/BANDAGES/DRESSINGS) ×1
BANDAGE ELASTIC 4 VELCRO ST LF (GAUZE/BANDAGES/DRESSINGS) ×2 IMPLANT
BANDAGE ELASTIC 6 VELCRO ST LF (GAUZE/BANDAGES/DRESSINGS) ×1 IMPLANT
BANDAGE ESMARK 6X9 LF (GAUZE/BANDAGES/DRESSINGS) ×1 IMPLANT
BANDAGE GAUZE ELAST BULKY 4 IN (GAUZE/BANDAGES/DRESSINGS) ×3 IMPLANT
BENZOIN TINCTURE PRP APPL 2/3 (GAUZE/BANDAGES/DRESSINGS) ×1 IMPLANT
BLADE SAGITTAL 25.0X1.19X90 (BLADE) ×2 IMPLANT
BLADE SURG ROTATE 9660 (MISCELLANEOUS) IMPLANT
BNDG CMPR 9X6 STRL LF SNTH (GAUZE/BANDAGES/DRESSINGS) ×1
BNDG CMPR MED 10X6 ELC LF (GAUZE/BANDAGES/DRESSINGS) ×1
BNDG ELASTIC 6X10 VLCR STRL LF (GAUZE/BANDAGES/DRESSINGS) ×2 IMPLANT
BNDG ESMARK 6X9 LF (GAUZE/BANDAGES/DRESSINGS) ×2
BOWL SMART MIX CTS (DISPOSABLE) ×2 IMPLANT
CAPT RP KNEE ×1 IMPLANT
CEMENT HV SMART SET (Cement) ×4 IMPLANT
CLOTH BEACON ORANGE TIMEOUT ST (SAFETY) ×2 IMPLANT
COVER SURGICAL LIGHT HANDLE (MISCELLANEOUS) ×2 IMPLANT
CUFF TOURNIQUET SINGLE 34IN LL (TOURNIQUET CUFF) ×2 IMPLANT
CUFF TOURNIQUET SINGLE 44IN (TOURNIQUET CUFF) IMPLANT
DRAPE EXTREMITY T 121X128X90 (DRAPE) ×2 IMPLANT
DRAPE PROXIMA HALF (DRAPES) ×2 IMPLANT
DRAPE U-SHAPE 47X51 STRL (DRAPES) ×2 IMPLANT
DRSG ADAPTIC 3X8 NADH LF (GAUZE/BANDAGES/DRESSINGS) ×2 IMPLANT
DRSG PAD ABDOMINAL 8X10 ST (GAUZE/BANDAGES/DRESSINGS) ×2 IMPLANT
DURAPREP 26ML APPLICATOR (WOUND CARE) ×2 IMPLANT
ELECT REM PT RETURN 9FT ADLT (ELECTROSURGICAL) ×2
ELECTRODE REM PT RTRN 9FT ADLT (ELECTROSURGICAL) ×1 IMPLANT
FACESHIELD LNG OPTICON STERILE (SAFETY) ×4 IMPLANT
GLOVE BIO SURGEON STRL SZ8.5 (GLOVE) ×2 IMPLANT
GLOVE BIOGEL PI IND STRL 8 (GLOVE) ×1 IMPLANT
GLOVE BIOGEL PI IND STRL 8.5 (GLOVE) ×1 IMPLANT
GLOVE BIOGEL PI INDICATOR 8 (GLOVE) ×1
GLOVE BIOGEL PI INDICATOR 8.5 (GLOVE) ×1
GLOVE SS BIOGEL STRL SZ 8 (GLOVE) ×1 IMPLANT
GLOVE SUPERSENSE BIOGEL SZ 8 (GLOVE) ×1
GOWN PREVENTION PLUS XLARGE (GOWN DISPOSABLE) ×2 IMPLANT
GOWN PREVENTION PLUS XXLARGE (GOWN DISPOSABLE) ×2 IMPLANT
GOWN STRL NON-REIN LRG LVL3 (GOWN DISPOSABLE) ×3 IMPLANT
HANDPIECE INTERPULSE COAX TIP (DISPOSABLE) ×2
HOOD PEEL AWAY FACE SHEILD DIS (HOOD) ×2 IMPLANT
IMMOBILIZER KNEE 20 (SOFTGOODS)
IMMOBILIZER KNEE 20 THIGH 36 (SOFTGOODS) IMPLANT
IMMOBILIZER KNEE 22 UNIV (SOFTGOODS) ×2 IMPLANT
IMMOBILIZER KNEE 24 THIGH 36 (MISCELLANEOUS) IMPLANT
IMMOBILIZER KNEE 24 UNIV (MISCELLANEOUS)
KIT BASIN OR (CUSTOM PROCEDURE TRAY) ×2 IMPLANT
KIT ROOM TURNOVER OR (KITS) ×2 IMPLANT
MANIFOLD NEPTUNE II (INSTRUMENTS) ×2 IMPLANT
NS IRRIG 1000ML POUR BTL (IV SOLUTION) ×2 IMPLANT
PACK TOTAL JOINT (CUSTOM PROCEDURE TRAY) ×2 IMPLANT
PAD ARMBOARD 7.5X6 YLW CONV (MISCELLANEOUS) ×4 IMPLANT
SET HNDPC FAN SPRY TIP SCT (DISPOSABLE) ×1 IMPLANT
SPONGE GAUZE 4X4 12PLY (GAUZE/BANDAGES/DRESSINGS) ×2 IMPLANT
STAPLER VISISTAT 35W (STAPLE) IMPLANT
STRIP CLOSURE SKIN 1/2X4 (GAUZE/BANDAGES/DRESSINGS) ×1 IMPLANT
SUCTION FRAZIER TIP 10 FR DISP (SUCTIONS) IMPLANT
SUT MNCRL AB 3-0 PS2 18 (SUTURE) ×1 IMPLANT
SUT VIC AB 0 CT1 27 (SUTURE) ×4
SUT VIC AB 0 CT1 27XBRD ANBCTR (SUTURE) ×2 IMPLANT
SUT VIC AB 2-0 CT1 27 (SUTURE) ×4
SUT VIC AB 2-0 CT1 TAPERPNT 27 (SUTURE) ×2 IMPLANT
SUT VLOC 180 0 24IN GS25 (SUTURE) ×2 IMPLANT
TOWEL OR 17X24 6PK STRL BLUE (TOWEL DISPOSABLE) ×2 IMPLANT
TOWEL OR 17X26 10 PK STRL BLUE (TOWEL DISPOSABLE) ×2 IMPLANT
TRAY FOLEY CATH 16FRSI W/METER (SET/KITS/TRAYS/PACK) ×2 IMPLANT
WATER STERILE IRR 1000ML POUR (IV SOLUTION) ×4 IMPLANT

## 2013-04-06 NOTE — Preoperative (Signed)
Beta Blockers   Reason not to administer Beta Blockers:Not Applicable 

## 2013-04-06 NOTE — Transfer of Care (Signed)
Immediate Anesthesia Transfer of Care Note  Patient: Jamie Espinoza  Procedure(s) Performed: Procedure(s): TOTAL KNEE ARTHROPLASTY (Right)  Patient Location: PACU  Anesthesia Type:Spinal and MAC combined with regional for post-op pain  Level of Consciousness: awake, alert , oriented and patient cooperative  Airway & Oxygen Therapy: Patient Spontanous Breathing and Patient connected to nasal cannula oxygen  Post-op Assessment: Report given to PACU RN and Post -op Vital signs reviewed and stable  Post vital signs: Reviewed and stable  Complications: No apparent anesthesia complications

## 2013-04-06 NOTE — Anesthesia Postprocedure Evaluation (Signed)
  Anesthesia Post-op Note  Patient: Jamie Espinoza  Procedure(s) Performed: Procedure(s): TOTAL KNEE ARTHROPLASTY (Right)  Patient Location: PACU  Anesthesia Type:Spinal  Level of Consciousness: awake and alert   Airway and Oxygen Therapy: Patient Spontanous Breathing  Post-op Pain: none  Post-op Assessment: Post-op Vital signs reviewed, Patient's Cardiovascular Status Stable, Respiratory Function Stable, Patent Airway, No signs of Nausea or vomiting, Pain level controlled, No backache and No residual motor weakness  Post-op Vital Signs: stable  Complications: No apparent anesthesia complications

## 2013-04-06 NOTE — Interval H&P Note (Signed)
History and Physical Interval Note:  04/06/2013 7:27 AM  Jamie Espinoza  has presented today for surgery, with the diagnosis of DEGENERATIVE JOINT DISEASE RIGHT KNEE  The various methods of treatment have been discussed with the patient and family. After consideration of risks, benefits and other options for treatment, the patient has consented to  Procedure(s): TOTAL KNEE ARTHROPLASTY (Right) as a surgical intervention .  The patient's history has been reviewed, patient examined, no change in status, stable for surgery.  I have reviewed the patient's chart and labs.  Questions were answered to the patient's satisfaction.     Hakeen Shipes G

## 2013-04-06 NOTE — Progress Notes (Signed)
Orthopedic Tech Progress Note Patient Details:  Jamie Espinoza [birth year] [id number]  CPM Right Knee CPM Right Knee: On Right Knee Flexion (Degrees): 60 Right Knee Extension (Degrees): 0 Additional Comments: Trapeze bar and foot roll   Cammer, Mickie Bail 04/06/2013, 12:11 PM

## 2013-04-06 NOTE — Op Note (Signed)
PREOP DIAGNOSIS: DJD RIGHT KNEE POSTOP DIAGNOSIS: same PROCEDURE: RIGHT TKR ANESTHESIA: spinal and block ATTENDING SURGEON: Ichelle Harral G ASSISTANT: Lindwood Qua PA and April Green RNFA  INDICATIONS FOR PROCEDURE: Jamie Espinoza is a 62 y.o. female who has struggled for a long time with pain due to degenerative arthritis of the right knee.  The patient has failed many conservative non-operative measures and at this point has pain which limits the ability to sleep and walk.  The patient is offered total knee replacement.  Informed operative consent was obtained after discussion of possible risks of anesthesia, infection, neurovascular injury, DVT, and death.  The importance of the post-operative rehabilitation protocol to optimize result was stressed extensively with the patient.  SUMMARY OF FINDINGS AND PROCEDURE:  Jamie Espinoza was taken to the operative suite where under the above anesthesia a right knee replacement was performed.  There were advanced degenerative changes and the bone quality was good.  We used the DePuy system and placed size standard plus femur, 3 tibia, 38 mm all polyethylene patella, and a size 10 mm spacer.  The patient was admitted for appropriate post-op care to include perioperative antibiotics and mechanical and pharmacologic measures for DVT prophylaxis.  DESCRIPTION OF PROCEDURE:  Jamie Espinoza was taken to the operative suite where the above anesthesia was applied.  The patient was positioned supine and prepped and draped in normal sterile fashion.  An appropriate time out was performed.  After the administration of Kefzol pre-op antibiotic the leg was elevated and exsanguinated and a tourniquet inflated. A standard longitudinal incision was made on the anterior knee.  Dissection was carried down to the extensor mechanism.  All appropriate anti-infective measures were used including the pre-operative antibiotic, betadine impregnated drape, and closed hooded exhaust  systems for each member of the surgical team.  A medial parapatellar incision was made in the extensor mechanism and the knee cap flipped and the knee flexed.  Some residual meniscal tissues were removed along with any remaining ACL/PCL tissue.  A guide was placed on the tibia and a flat cut was made on it's superior surface.  An intramedullary guide was placed in the femur and was utilized to make anterior and posterior cuts creating an appropriate flexion gap.  A second intramedullary guide was placed in the femur to make a distal cut properly balancing the knee with an extension gap equal to the flexion gap.  The three bones sized to the above mentioned sizes and the appropriate guides were placed and utilized.  A trial reduction was done and the knee easily came to full extension and the patella tracked well on flexion but the joint felt tight and a partial lateral release was done with the cautery.  The trial components were removed and all bones were cleaned with pulsatile lavage and then dried thoroughly.  Cement was mixed and was pressurized onto the bones followed by placement of the aforementioned components.  Excess cement was trimmed and pressure was held on the components until the cement had hardened.  The tourniquet was deflated and a small amount of bleeding was controlled with cautery and pressure.  The knee was irrigated thoroughly.  The extensor mechanism was re-approximated with V-loc suture in running fashion.  The knee was flexed and the repair was solid.  The subcutaneous tissues were re-approximated with #0 and #2-0 vicryl and the skin closed with a subcuticular stitch and steristrips.  A sterile dressing was applied.  Intraoperative fluids, EBL, and tourniquet time  can be obtained from anesthesia records. We did administer tranexamic acid during the case.  DISPOSITION:  The patient was taken to recovery room in stable condition and admitted for appropriate post-op care to include  peri-operative antibiotic and DVT prophylaxis with mechanical and pharmacologic measures.  Britt Petroni G 04/06/2013, 9:24 AM

## 2013-04-06 NOTE — Anesthesia Procedure Notes (Addendum)
Spinal  Patient location during procedure: OR Start time: 04/06/2013 7:37 AM End time: 04/06/2013 7:45 AM Preanesthetic Checklist Completed: patient identified, site marked, surgical consent, pre-op evaluation, timeout performed, IV checked, risks and benefits discussed and monitors and equipment checked Spinal Block Patient position: sitting Prep: Betadine Patient monitoring: heart rate, cardiac monitor, continuous pulse ox and blood pressure Approach: midline Location: L3-4 Injection technique: single-shot Needle Needle gauge: 22 G Assessment Sensory level: T6 Additional Notes [phone] SAB Sitting position Betadine prep Sterile tech #22 spinal needle with clear csf in midline approach No paresthesias Vernia Buff .75% w/epi .6cc Approx T6 level bilat No compl Dr Gypsy Balsam  Anesthesia Regional Block:  Femoral nerve block  Pre-Anesthetic Checklist: ,, timeout performed, Correct Patient, Correct Site, Correct Laterality, Correct Procedure, Correct Position, site marked, Risks and benefits discussed,  Surgical consent,  Pre-op evaluation,  At surgeon's request and post-op pain management  Laterality: Right  Prep: chloraprep       Needles:  Injection technique: Single-shot     Needle Length: 5cm 5 cm Needle Gauge: 22 and 22 G    Additional Needles:  Procedures: nerve stimulator Femoral nerve block  Nerve Stimulator or Paresthesia:  Response: 0.48 mA,   Additional Responses:   Narrative:  Start time: 04/06/2013 7:07 AM End time: 04/06/2013 7:14 AM Injection made incrementally with aspirations every 5 mL. Anesthesiologist: Dr Gypsy Balsam  Additional Notes: [id number] R FNB POP CHG prep, sterile tech #22 stim/echo needle with stim down to .48ma Multiple neg asp Marc .5% w/epi 1:200000 total 25cc+decadron 4mg  infiltrated No compl Dr Gypsy Balsam

## 2013-04-06 NOTE — Progress Notes (Signed)
UR COMPLETED  

## 2013-04-06 NOTE — Evaluation (Signed)
Physical Therapy Evaluation Patient Details Name: Jamie Espinoza MRN: [id number] DOB: 02/25/1951 Today's Date: 04/06/2013 Time: 4098-1191 PT Time Calculation (min): 23 min  PT Assessment / Plan / Recommendation History of Present Illness     Clinical Impression  Pt is s/p R TKA resulting in the deficits listed below (see PT Problem List).  Pt will benefit from skilled PT to increase their independence and safety with mobility to allow discharge to the venue listed below.  Pt ambulated around room POD#0 using bilateral platform RW from home (hx of carpal tunnel surgeries this year).  Rear legs of RW needed to be raised however RW short size so family to check with supplier.  Pt also educated to maintain straight knee with no pillows as well as perform ankle pumps throughout the day as tolerated.      PT Assessment  Patient needs continued PT services    Follow Up Recommendations  Home health PT;Supervision for mobility/OOB    Does the patient have the potential to tolerate intense rehabilitation      Barriers to Discharge        Equipment Recommendations  None recommended by PT (family to check on exchanging RW for better height)    Recommendations for Other Services     Frequency 7X/week    Precautions / Restrictions Precautions Precautions: Knee Restrictions Other Position/Activity Restrictions: WBAT R LE   Pertinent Vitals/Pain C/o minimal R knee pain, not yet time for meds per pt however repositioned and applied ice packs.      Mobility  Bed Mobility Bed Mobility: Supine to Sit Supine to Sit: 4: Min assist Details for Bed Mobility Assistance: verbal cues for technique, assist for R LE Transfers Transfers: Sit to Stand;Stand to Sit Sit to Stand: 4: Min guard;With upper extremity assist;From bed Stand to Sit: 4: Min guard;With upper extremity assist;To chair/3-in-1 Details for Transfer Assistance: verbal cues for safe technique Ambulation/Gait Ambulation/Gait  Assistance: 4: Min guard Ambulation Distance (Feet): 18 Feet Assistive device: Left platform walker;Right platform walker Ambulation/Gait Assistance Details: pt ambulated with platform RW from home, needed to be adjusted higher however pt with shorter RW so family to check with DME rep, verbal cues for sequence, technique, posture Gait Pattern: Step-to pattern;Decreased stance time - right;Trunk flexed General Gait Details: platforms bilaterally due to hx of carpal tunnel surgery this year    Exercises     PT Diagnosis: Difficulty walking;Acute pain  PT Problem List: Decreased strength;Decreased range of motion;Decreased activity tolerance;Decreased mobility;Decreased knowledge of precautions;Pain;Decreased knowledge of use of DME PT Treatment Interventions: DME instruction;Gait training;Stair training;Functional mobility training;Therapeutic activities;Therapeutic exercise;Patient/family education     PT Goals(Current goals can be found in the care plan section) Acute Rehab PT Goals Patient Stated Goal: return home PT Goal Formulation: With patient Time For Goal Achievement: 04/13/13 Potential to Achieve Goals: Good  Visit Information  Last PT Received On: 04/06/13 Assistance Needed: +1       Prior Functioning  Home Living Family/patient expects to be discharged to:: Private residence Living Arrangements: Non-relatives/Friends Available Help at Discharge: Family;Available PRN/intermittently Type of Home: House Home Access: Stairs to enter [employer] of Steps: 3 Home Layout: One level Home Equipment: Toilet riser Additional Comments: pt with new platform RW just prior to surgery Prior Function Level of Independence: Independent Communication Communication: No difficulties    Cognition  Cognition Arousal/Alertness: Awake/alert Behavior During Therapy: WFL for tasks assessed/performed Overall Cognitive Status: Within Functional Limits for tasks assessed     Extremity/Trunk  Assessment Upper Extremity Assessment Upper Extremity Assessment:  (pt reports carpal tunnel surgery bilaterally this year) Lower Extremity Assessment Lower Extremity Assessment: RLE deficits/detail RLE Deficits / Details: assist required for bed mobility, able to pump ankles well   Balance    End of Session PT - End of Session Equipment Utilized During Treatment: Gait belt;Right knee immobilizer Activity Tolerance: Patient tolerated treatment well Patient left: with family/visitor present;in chair;with call bell/phone within reach;with nursing/sitter in room CPM Right Knee CPM Right Knee: Off  GP     Inessa Wardrop,KATHrine E 04/06/2013, 4:06 PM Zenovia Jarred, PT, DPT 04/06/2013 Pager: [phone]

## 2013-04-06 NOTE — Anesthesia Preprocedure Evaluation (Addendum)
Anesthesia Evaluation  Patient identified by MRN, date of birth, ID band Patient awake    Reviewed: Allergy & Precautions, H&P , NPO status , Patient's Chart, lab work & pertinent test results, reviewed documented beta blocker date and time   Airway Mallampati: II TM Distance: <3 FB Neck ROM: Full    Dental  (+) Teeth Intact and Dental Advisory Given   Pulmonary sleep apnea and Continuous Positive Airway Pressure Ventilation ,    + decreased breath sounds      Cardiovascular hypertension, Pt. on medications Rhythm:Regular Rate:Normal     Neuro/Psych Anxiety    GI/Hepatic GERD-  Controlled and Medicated,  Endo/Other  Hypothyroidism   Renal/GU      Musculoskeletal   Abdominal (+) + obese,   Peds  Hematology   Anesthesia Other Findings   Reproductive/Obstetrics                          Anesthesia Physical Anesthesia Plan  ASA: III  Anesthesia Plan: Spinal   Post-op Pain Management:    Induction:   Airway Management Planned: Simple Face Mask  Additional Equipment:   Intra-op Plan:   Post-operative Plan:   Informed Consent: I have reviewed the patients History and Physical, chart, labs and discussed the procedure including the risks, benefits and alternatives for the proposed anesthesia with the patient or authorized representative who has indicated his/her understanding and acceptance.     Plan Discussed with: CRNA and Surgeon  Anesthesia Plan Comments:         Anesthesia Quick Evaluation

## 2013-04-07 LAB — BASIC METABOLIC PANEL
CO2: 30 mEq/L (ref 19–32)
Chloride: 101 mEq/L (ref 96–112)
Creatinine, Ser: 0.6 mg/dL (ref 0.50–1.10)
GFR calc Af Amer: >90 mL/min (ref >90)
Potassium: 3.4 mEq/L — ABNORMAL LOW (ref 3.5–5.1)
Sodium: 137 mEq/L (ref 135–145)

## 2013-04-07 LAB — CBC
MCV: 86.9 fL (ref 78.0–100.0)
Platelets: 165 10*3/uL (ref 150–400)
RBC: 3.58 MIL/uL — ABNORMAL LOW (ref 3.87–5.11)
RDW: 13.2 % (ref 11.5–15.5)
WBC: 9.3 10*3/uL (ref 4.0–10.5)

## 2013-04-07 MED ORDER — SODIUM CHLORIDE 0.9 % IV BOLUS (SEPSIS)
500.0000 mL | Freq: Once | INTRAVENOUS | Status: AC
  Administered 2013-04-07: 500 mL via INTRAVENOUS

## 2013-04-07 NOTE — Progress Notes (Signed)
Physical Therapy Treatment Patient Details Name: Jamie Espinoza MRN: [id number] DOB: 1951-06-04 Today's Date: 04/07/2013 Time: 8295-6213 PT Time Calculation (min): 28 min  PT Assessment / Plan / Recommendation  History of Present Illness s/p elective Rt TKA   PT Comments   Pt moving much better this afternoon, was able to increase mobility with platform RW. Anticipate pt to be ready from mobility standpoint to D/C tomorrow once stair goal is addressed.   Follow Up Recommendations  Home health PT;Supervision for mobility/OOB     Does the patient have the potential to tolerate intense rehabilitation     Barriers to Discharge        Equipment Recommendations  None recommended by PT    Recommendations for Other Services    Frequency 7X/week   Progress towards PT Goals Progress towards PT goals: Progressing toward goals  Plan Current plan remains appropriate    Precautions / Restrictions Precautions Precautions: Knee;Fall Precaution Comments: reviewed HEP  Required Braces or Orthoses: Knee Immobilizer - Right Restrictions Weight Bearing Restrictions: Yes RLE Weight Bearing: Weight bearing as tolerated   Pertinent Vitals/Pain 5/10; RN notified.     Mobility  Bed Mobility Bed Mobility: Supine to Sit;Sitting - Scoot to [place] of Bed;Sit to Supine Supine to Sit: 5: Supervision;HOB flat Sitting - Scoot to Edge of Bed: 5: Supervision Sit to Supine: 4: Min assist;HOB flat Details for Bed Mobility Assistance: HOB flattened to simulate home enviroment; pt able to perform supine to sit without physical (A), pt requires (A) to advance Rt LE onto bed; cues for sequencing and technique  Transfers Transfers: Sit to Stand;Stand to Sit Sit to Stand: From bed;5: Supervision Stand to Sit: 5: Supervision;With armrests;To bed Details for Transfer Assistance: supervision for safety and technique; no physical (A) needed  Ambulation/Gait Ambulation/Gait Assistance: 5: Supervision Ambulation  Distance (Feet): 300 Feet Assistive device: Left platform walker;Right platform walker Ambulation/Gait Assistance Details: cues for gt sequencing; encouraged to equalize stride length and progress to step through gt; cues for management of RW  Gait Pattern: Step-to pattern;Step-through pattern;Decreased stance time - right;Decreased step length - left;Narrow base of support Gait velocity: decr  General Gait Details: platforms bilaterally due to hx of carpal tunnel surgery this year Stairs: No Wheelchair Mobility Wheelchair Mobility: No    Exercises Total Joint Exercises Ankle Circles/Pumps: AROM;Both;10 reps;Supine Quad Sets: AROM;Right;10 reps;Supine Heel Slides: AROM;Right;10 reps;Seated Hip ABduction/ADduction: AROM;Right;10 reps;Supine Long Arc Quad: AROM;Right;10 reps;Seated Goniometric ROM: Rt knee flex AROM 90 degrees in sitting    PT Diagnosis:    PT Problem List:   PT Treatment Interventions:     PT Goals (current goals can now be found in the care plan section) Acute Rehab PT Goals Patient Stated Goal: home tomorrow PT Goal Formulation: With patient Time For Goal Achievement: 04/13/13 Potential to Achieve Goals: Good  Visit Information  Last PT Received On: 04/07/13 Assistance Needed: +1 History of Present Illness: s/p elective Rt TKA    Subjective Data  Subjective: "im feeling much better now. I hope i dont feel like i did early when we walk"  Patient Stated Goal: home tomorrow   Cognition  Cognition Arousal/Alertness: Awake/alert Behavior During Therapy: WFL for tasks assessed/performed Overall Cognitive Status: Within Functional Limits for tasks assessed    Balance  Balance Balance Assessed: Yes Static Sitting Balance Static Sitting - Balance Support: Feet supported;No upper extremity supported Static Sitting - Level of Assistance: 5: Stand by assistance  End of Session PT - End of Session Equipment  Utilized During Treatment: Gait belt;Right knee  immobilizer Activity Tolerance: Patient tolerated treatment well Patient left: in bed;with call bell/phone within reach Nurse Communication: Mobility status;Patient requests pain meds   GP     Donell Sievert, [place] [phone] 04/07/2013, 1:52 PM

## 2013-04-07 NOTE — Progress Notes (Signed)
OT Cancellation Note  Patient Details Name: CATERRA OSTROFF MRN: [id number] DOB: 23-Feb-1951   Cancelled Treatment:    Reason Eval/Treat Not Completed: OT screened, no needs identified, will sign off. Pt reports she will have 24/7 supervision/assist and has no DME needs (has a tub transfer bench and 3n1).     04/07/2013 Cipriano Mile OTR/L Pager [phone] Office [phone]

## 2013-04-07 NOTE — Progress Notes (Signed)
Physical Therapy Treatment Patient Details Name: Jamie Espinoza MRN: [id number] DOB: [birth year] Today's Date: 04/07/2013 Time: 0865-7846 PT Time Calculation (min): 32 min  PT Assessment / Plan / Recommendation  History of Present Illness s/p elective Rt TKA   PT Comments   Pt limited with amb today due to drop in BP with activity. Pt BP after activity 78/35; pt with signs and symptoms of orthostatic hypotension; pt c/o dizziness, was diaphoretic during session. Pt was alert and oriented during session. Pt highly motivated to increase activity and return home tomorrow.   Follow Up Recommendations  Home health PT;Supervision for mobility/OOB     Does the patient have the potential to tolerate intense rehabilitation     Barriers to Discharge        Equipment Recommendations  None recommended by PT    Recommendations for Other Services    Frequency 7X/week   Progress towards PT Goals Progress towards PT goals: Progressing toward goals  Plan Current plan remains appropriate    Precautions / Restrictions Precautions Precautions: Knee;Fall Precaution Comments: pt given TKA HEP  Required Braces or Orthoses: Knee Immobilizer - Right Restrictions Weight Bearing Restrictions: Yes RLE Weight Bearing: Weight bearing as tolerated   Pertinent Vitals/Pain 4/10; RN provided medication to assist with pain control     Mobility  Bed Mobility Bed Mobility: Supine to Sit;Sitting - Scoot to Edge of Bed Supine to Sit: 5: Supervision;HOB flat Sitting - Scoot to Edge of Bed: 5: Supervision Details for Bed Mobility Assistance: vc's for sequencing and pt required increased time; pt able to (A) Rt LE off EOB with UEs; no physical (A) required  Transfers Transfers: Sit to Stand;Stand to Sit Sit to Stand: 4: Min guard;From bed Stand to Sit: 4: Min guard;To chair/3-in-1;With armrests Details for Transfer Assistance: min guard for safety and cues for technique  Ambulation/Gait Ambulation/Gait  Assistance: 4: Min guard Ambulation Distance (Feet): 20 Feet Assistive device: Left platform walker;Right platform walker Ambulation/Gait Assistance Details: cues for gt sequencing and safety; pt became diaphoretic and pale; pt returned to sitting position and BP take; BP 78/35 in sitting and pt felt dizzy; pt LEs elevated; pt alert and oriented but very fatigued with amb Gait Pattern: Step-to pattern;Decreased stance time - right;Trunk flexed Gait velocity: decr  General Gait Details: platforms bilaterally due to hx of carpal tunnel surgery this year Stairs: No Wheelchair Mobility Wheelchair Mobility: No    Exercises Total Joint Exercises Ankle Circles/Pumps: AROM;Both;10 reps;Supine Quad Sets: AROM;Right;10 reps;Supine Heel Slides: AAROM;Right;10 reps;Seated;Other (comment) (pt uses towel and UEs to (A) with exercise) Hip ABduction/ADduction: AROM;Right;10 reps;Supine Goniometric ROM: Rt Knee flex AROM 60 degrees    PT Diagnosis:    PT Problem List:   PT Treatment Interventions:     PT Goals (current goals can now be found in the care plan section) Acute Rehab PT Goals Patient Stated Goal: home tomorrow PT Goal Formulation: With patient Time For Goal Achievement: 04/13/13 Potential to Achieve Goals: Good  Visit Information  Last PT Received On: 04/07/13 Assistance Needed: +1 History of Present Illness: s/p elective Rt TKA    Subjective Data  Subjective: "im ready to get up and move. I hope to go home tomorrow" Patient Stated Goal: home tomorrow   Cognition  Cognition Arousal/Alertness: Awake/alert Behavior During Therapy: WFL for tasks assessed/performed Overall Cognitive Status: Within Functional Limits for tasks assessed    Balance  Balance Balance Assessed: Yes Static Sitting Balance Static Sitting - Balance Support: Feet supported;No upper extremity  supported Static Sitting - Level of Assistance: 5: Stand by assistance  End of Session PT - End of  Session Equipment Utilized During Treatment: Gait belt;Right knee immobilizer Activity Tolerance: Patient limited by fatigue;Other (comment) (BP dropped with activity) Patient left: in chair;with call bell/phone within reach;Other (comment) (in footsie roll) Nurse Communication: Mobility status;Other (comment) (BP)   GP     Donell Sievert, [place] [phone] 04/07/2013, 10:45 AM

## 2013-04-07 NOTE — Progress Notes (Addendum)
Returned page re:  Patient low BP with symptoms - NS bolus order received. PT reported systolic of 73 sitting.

## 2013-04-07 NOTE — Progress Notes (Signed)
04/07/13 Spoke with patient about HHC. She selected [facility] from [facility] agencies list. Dava Najjar with [facility] and set up HHPT. CPM and rolling walker provoded by [employer]. Jacquelynn Cree RN, BSN, CCM

## 2013-04-07 NOTE — Progress Notes (Signed)
Orthopedic Tech Progress Note Patient Details:  Jamie Espinoza Oct 01, 1950 [id number] On cpm at 8:00 pm RLE 0-65 Patient ID: Jamie Espinoza, female   DOB: Dec 19, 1950, 62 y.o.   MRN: [id number]   Jamie Espinoza 04/07/2013, 8:03 PM

## 2013-04-07 NOTE — Progress Notes (Signed)
Subjective: 1 Day Post-Op Procedure(s) (LRB): TOTAL KNEE ARTHROPLASTY (Right)  Activity level:  wbat Diet tolerance:  ok Voiding:  ok Patient reports pain as 3 on 0-10 scale.    Objective: Vital signs in last 24 hours: Temp:  [97 F (36.1 C)-98.1 F (36.7 C)] 97.5 F (36.4 C) (09/03 0539) Pulse Rate:  [60-82] 75 (09/03 0539) Resp:  [12-20] 17 (09/03 0539) BP: (112-142)/(53-80) 135/56 mmHg (09/03 0539) SpO2:  [95 %-100 %] 95 % (09/03 0539) Weight:  [93.895 kg (207 lb)] 93.895 kg (207 lb) (09/02 0817)  Labs:  Recent Labs  04/07/13 0625  HGB 10.8*    Recent Labs  04/07/13 0625  WBC 9.3  RBC 3.58*  HCT 31.1*  PLT 165    Recent Labs  04/07/13 0625  NA 137  K 3.4*  CL 101  CO2 30  BUN 7  CREATININE 0.60  GLUCOSE 113*  CALCIUM 8.7   No results found for this basename: LABPT, INR,  in the last 72 hours  Physical Exam:  Neurologically intact ABD soft Neurovascular intact Sensation intact distally Intact pulses distally Dorsiflexion/Plantar flexion intact Incision: dressing C/D/I No cellulitis present Compartment soft  Assessment/Plan:  1 Day Post-Op Procedure(s) (LRB): TOTAL KNEE ARTHROPLASTY (Right) Advance diet Up with therapy D/C IV fluids Plan for discharge tomorrow to home with [facility] ASA 325 BID x 2 weeks /scd's Change dressing today WBAT /cpm    Lathaniel Legate R 04/07/2013, 8:04 AM

## 2013-04-07 NOTE — Progress Notes (Signed)
Patient BP remains 120 systolic and patient states feeling better after most of bolus in. Patient dropped Robaxin and Protonix on floor - replaced.

## 2013-04-08 ENCOUNTER — Encounter (HOSPITAL_COMMUNITY): Payer: Self-pay | Admitting: General Practice

## 2013-04-08 LAB — CBC
HCT: 32.9 % — ABNORMAL LOW (ref 36.0–46.0)
Hemoglobin: 11.5 g/dL — ABNORMAL LOW (ref 12.0–15.0)
MCV: 85.9 fL (ref 78.0–100.0)
RBC: 3.83 MIL/uL — ABNORMAL LOW (ref 3.87–5.11)
RDW: 13.2 % (ref 11.5–15.5)
WBC: 10.4 10*3/uL (ref 4.0–10.5)

## 2013-04-08 MED ORDER — HYDROCODONE-ACETAMINOPHEN 7.5-325 MG PO TABS: 1.0000 | ORAL_TABLET | ORAL | Status: DC | PRN

## 2013-04-08 MED ORDER — ASPIRIN 325 MG PO TBEC: 325.0000 mg | DELAYED_RELEASE_TABLET | Freq: Two times a day (BID) | ORAL | Status: DC

## 2013-04-08 MED ORDER — METHOCARBAMOL 500 MG PO TABS: 500.0000 mg | ORAL_TABLET | Freq: Four times a day (QID) | ORAL | Status: DC | PRN

## 2013-04-08 NOTE — Progress Notes (Signed)
Subjective: 2 Days Post-Op Procedure(s) (LRB): TOTAL KNEE ARTHROPLASTY (Right)  Activity level:  Passed pt  Diet tolerance:  eating Voiding:  ok Patient reports pain as 2 on 0-10 scale.    Objective: Vital signs in last 24 hours: Temp:  [98.1 F (36.7 C)-99.5 F (37.5 C)] 98.7 F (37.1 C) (09/04 0522) Pulse Rate:  [81-86] 86 (09/04 0522) Resp:  [16-17] 16 (09/04 0522) BP: (135-159)/(65-71) 150/65 mmHg (09/04 0522) SpO2:  [91 %-94 %] 94 % (09/04 0522)  Labs:  Recent Labs  04/07/13 0625 04/08/13 0640  HGB 10.8* 11.5*    Recent Labs  04/07/13 0625 04/08/13 0640  WBC 9.3 10.4  RBC 3.58* 3.83*  HCT 31.1* 32.9*  PLT 165 162    Recent Labs  04/07/13 0625  NA 137  K 3.4*  CL 101  CO2 30  BUN 7  CREATININE 0.60  GLUCOSE 113*  CALCIUM 8.7   No results found for this basename: LABPT, INR,  in the last 72 hours  Physical Exam:  Neurologically intact ABD soft Neurovascular intact Sensation intact distally Intact pulses distally Dorsiflexion/Plantar flexion intact Incision: dressing C/D/I No cellulitis present Compartment soft  Assessment/Plan:  2 Days Post-Op Procedure(s) (LRB): TOTAL KNEE ARTHROPLASTY (Right) Advance diet D/c home  ASA 325 BID X 2WEEKS hh PT wbat    Akim Watkinson R 04/08/2013, 11:47 AM

## 2013-04-08 NOTE — Progress Notes (Signed)
Physical Therapy Treatment Patient Details Name: Jamie Espinoza MRN: [id number] DOB: 19-Jun-1951 Today's Date: 04/08/2013 Time: 8416-6063 PT Time Calculation (min): 26 min  PT Assessment / Plan / Recommendation  History of Present Illness s/p elective Rt TKA   PT Comments   Patient is making good progress with PT.  From a mobility standpoint anticipate patient will be ready for DC home with daughter today. Pt and daughter educated on stair amb technique and proper guarding technique.Pt also verbalized and demo independence with HEP.      Follow Up Recommendations  Home health PT;Supervision for mobility/OOB     Does the patient have the potential to tolerate intense rehabilitation     Barriers to Discharge        Equipment Recommendations  None recommended by PT    Recommendations for Other Services    Frequency 7X/week   Progress towards PT Goals Progress towards PT goals: Progressing toward goals  Plan Current plan remains appropriate    Precautions / Restrictions Precautions Precautions: Knee;Fall Precaution Comments: reviewed HEP  Required Braces or Orthoses: Knee Immobilizer - Right Restrictions Weight Bearing Restrictions: Yes RLE Weight Bearing: Weight bearing as tolerated   Pertinent Vitals/Pain "i dont really have any today"pt premedicated     Mobility  Bed Mobility Bed Mobility: Supine to Sit;Sitting - Scoot to Edge of Bed Supine to Sit: 6: Modified independent (Device/Increase time);HOB flat;With rails Sitting - Scoot to Edge of Bed: 6: Modified independent (Device/Increase time) Details for Bed Mobility Assistance: HOB flattened; pt demo good technique;   Transfers Transfers: Sit to Stand;Stand to Sit Sit to Stand: 6: Modified independent (Device/Increase time);From chair/3-in-1;From bed;With armrests Stand to Sit: 6: Modified independent (Device/Increase time);To chair/3-in-1;With armrests Details for Transfer Assistance: pt demo good technique; no physical  (A) needed; relies on armrests  Ambulation/Gait Ambulation/Gait Assistance: 5: Supervision Ambulation Distance (Feet): 200 Feet Assistive device: Left platform walker;Right platform walker Ambulation/Gait Assistance Details: min cues for gt sequencing to equalize step length; spoke with DME rep about adjusting platform RW for pt comfort Gait Pattern: Step-to pattern;Step-through pattern;Decreased stance time - right;Decreased step length - left;Narrow base of support Gait velocity: decr  General Gait Details: platforms bilaterally due to hx of carpal tunnel surgery this year Stairs: Yes Stairs Assistance: 5: Supervision;4: Min guard Stairs Assistance Details (indicate cue type and reason): educated pt and family on gt sequencing and guarding technique Stair Management Technique: No rails;Backwards;With walker;Step to pattern Number of Stairs: 1 (x2) Wheelchair Mobility Wheelchair Mobility: No    Exercises Total Joint Exercises Ankle Circles/Pumps: AROM;Both;10 reps;Supine Quad Sets: AROM;Right;10 reps;Seated Heel Slides: AROM;Right;10 reps;Seated Long Arc Quad: AROM;Right;10 reps;Seated Goniometric ROM: Rt knee AROM 90 degrees in sitting    PT Diagnosis:    PT Problem List:   PT Treatment Interventions:     PT Goals (current goals can now be found in the care plan section) Acute Rehab PT Goals Patient Stated Goal: steps then home PT Goal Formulation: With patient Time For Goal Achievement: 04/13/13 Potential to Achieve Goals: Good  Visit Information  Last PT Received On: 04/08/13 Assistance Needed: +1 History of Present Illness: s/p elective Rt TKA    Subjective Data  Subjective: "i cant believe how much better i feel today. Lets do the steps so i can go home."  Patient Stated Goal: steps then home   Cognition  Cognition Arousal/Alertness: Awake/alert Behavior During Therapy: WFL for tasks assessed/performed Overall Cognitive Status: Within Functional Limits for tasks  assessed  Balance  Balance Balance Assessed: Yes Static Sitting Balance Static Sitting - Balance Support: Feet supported;No upper extremity supported Static Sitting - Level of Assistance: 5: Stand by assistance  End of Session PT - End of Session Equipment Utilized During Treatment: Right knee immobilizer Activity Tolerance: Patient tolerated treatment well Patient left: in chair;with call bell/phone within reach;with family/visitor present Nurse Communication: Mobility status CPM Right Knee CPM Right Knee: Off   GP     Donnamarie Poag N,PT [phone] 04/08/2013, 9:21 AM

## 2013-04-08 NOTE — Discharge Summary (Signed)
Patient ID: Jamie Espinoza MRN: [id number] DOB/AGE: 62/06/1951 62 y.o.  Admit date: 04/06/2013 Discharge date: 04/08/2013  Admission Diagnoses:  Principal Problem:   Right knee DJD   Discharge Diagnoses:  Same  Past Medical History  Diagnosis Date  . Sleep apnea     uses CPAP  . Hypothyroidism   . Hypertension     Does not see cardiologist  . High cholesterol   . GERD (gastroesophageal reflux disease)   . Anxiety   . Carpal tunnel syndrome   . History of anemia     d/t heavy menstrual bleeding  . DJD (degenerative joint disease)     Surgeries: Procedure(s): TOTAL KNEE ARTHROPLASTY on 04/06/2013   Consultants:    Discharged Condition: Improved  Hospital Course: Jamie Espinoza is an 62 y.o. female who was admitted 04/06/2013 for operative treatment ofRight knee DJD. Patient has severe unremitting pain that affects sleep, daily activities, and work/hobbies. After pre-op clearance the patient was taken to the operating room on 04/06/2013 and underwent  Procedure(s): TOTAL KNEE ARTHROPLASTY.    Patient was given perioperative antibiotics: Anti-infectives   Start     Dose/Rate Route Frequency Ordered Stop   04/06/13 1400  ceFAZolin (ANCEF) IVPB 2 g/50 mL premix     2 g 100 mL/hr over 30 Minutes Intravenous Every 6 hours 04/06/13 1130 04/06/13 2122   04/06/13 0600  ceFAZolin (ANCEF) IVPB 2 g/50 mL premix     2 g 100 mL/hr over 30 Minutes Intravenous On call to O.R. 04/05/13 1428 04/06/13 0745       Patient was given sequential compression devices, early ambulation, and chemoprophylaxis to prevent DVT.  Patient benefited maximally from hospital stay and there were no complications.    Recent vital signs: Patient Vitals for the past 24 hrs:  BP Temp Pulse Resp SpO2  04/08/13 0522 150/65 mmHg 98.7 F (37.1 C) 86 16 94 %  04/08/13 0400 - - - 17 -  04/08/13 0000 - - - 16 -  04/07/13 2145 159/67 mmHg 99.5 F (37.5 C) 81 - 91 %  04/07/13 2000 - - - 17 -  04/07/13 1300 135/71  mmHg 98.1 F (36.7 C) 86 17 93 %     Recent laboratory studies:  Recent Labs  04/07/13 0625 04/08/13 0640  WBC 9.3 10.4  HGB 10.8* 11.5*  HCT 31.1* 32.9*  PLT 165 162  NA 137  --   K 3.4*  --   CL 101  --   CO2 30  --   BUN 7  --   CREATININE 0.60  --   GLUCOSE 113*  --   CALCIUM 8.7  --      Discharge Medications:     Medication List    STOP taking these medications       celecoxib 200 MG capsule  Commonly known as:  CELEBREX      TAKE these medications       aspirin 325 MG EC tablet  Take 1 tablet (325 mg total) by mouth 2 (two) times daily.     B-complex with vitamin C tablet  Take 1 tablet by mouth at bedtime.     buPROPion 300 MG 24 hr tablet  Commonly known as:  WELLBUTRIN XL  Take 300 mg by mouth daily.     Calcium-Magnesium-Zinc 333.33-133.33-5 MG Tabs  Take 1 tablet by mouth 2 (two) times daily.     Co Q-10 200 MG Caps  Take 200 mg by mouth at  bedtime.     HYDROcodone-acetaminophen 7.5-325 MG per tablet  Commonly known as:  NORCO  Take 1-2 tablets by mouth every 4 (four) hours as needed.     levothyroxine 75 MCG tablet  Commonly known as:  SYNTHROID, LEVOTHROID  Take 75 mcg by mouth at bedtime.     losartan-hydrochlorothiazide 50-12.5 MG per tablet  Commonly known as:  HYZAAR  Take 1 tablet by mouth at bedtime.     methocarbamol 500 MG tablet  Commonly known as:  ROBAXIN  Take 1 tablet (500 mg total) by mouth every 6 (six) hours as needed.     MUCINEX ALLERGY 180 MG tablet  Generic drug:  fexofenadine  Take 180 mg by mouth daily.     multivitamin with minerals Tabs tablet  Take 1 tablet by mouth at bedtime.     niacin-simvastatin 1000-20 MG 24 hr tablet  Commonly known as:  SIMCOR  Take 1 tablet by mouth at bedtime.     omega-3 acid ethyl esters 1 G capsule  Commonly known as:  LOVAZA  Take 2 g by mouth 2 (two) times daily.     omeprazole 20 MG capsule  Commonly known as:  PRILOSEC  Take 20 mg by mouth at bedtime.      senna 8.6 MG tablet  Commonly known as:  SENOKOT  Take 1 tablet by mouth at bedtime as needed for constipation.     tetrahydrozoline-zinc 0.05-0.25 % ophthalmic solution  Commonly known as:  VISINE-AC  Place 2 drops into both eyes 3 (three) times daily as needed (itching and redness of eyes).     vitamin C 500 MG tablet  Commonly known as:  ASCORBIC ACID  Take 500 mg by mouth at bedtime.     Vitamin D 2000 UNITS Caps  Take 2,000 Units by mouth at bedtime.     vitamin E 400 UNIT capsule  Generic drug:  vitamin E  Take 400 Units by mouth at bedtime.        Diagnostic Studies: Dg Chest 2 View  03/26/2013   CLINICAL DATA:  62 year old female preoperative study for knee surgery. Hypertension.  EXAM: CHEST  2 VIEW  COMPARISON:  None.  FINDINGS: The heart size and mediastinal contours are within normal limits. Minor increased linear opacity at the left lung base most resembles mild scarring or atelectasis. Otherwise both lungs are clear. The visualized skeletal structures are unremarkable.  IMPRESSION: No acute cardiopulmonary abnormality. Minor left lung base scarring or atelectasis suspected.   Electronically Signed   By: Augusto Gamble   On: 03/26/2013 10:17    Disposition: Final discharge disposition not confirmed      Discharge Orders   Future Orders Complete By Expires   Call MD / Call 911  As directed    Comments:     If you experience chest pain or shortness of breath, CALL 911 and be transported to the hospital emergency room.  If you develope a fever above 101 F, pus (white drainage) or increased drainage or redness at the wound, or calf pain, call your surgeon's office.   Constipation Prevention  As directed    Comments:     Drink plenty of fluids.  Prune juice may be helpful.  You may use a stool softener, such as Colace (over the counter) 100 mg twice a day.  Use MiraLax (over the counter) for constipation as needed.   Diet - low sodium heart healthy  As directed    Increase  activity  slowly as tolerated  As directed       Follow-up Information   Follow up with Velna Ochs, MD. Call in 2 weeks.   Specialty:  Orthopedic Surgery   Contact information:   [address] [place] [address] [phone]       Follow up with [facility]. (home physical therapy, they will call you)    Contact information:   [address] [place] [address] [phone]        Signed: Prince Rome 04/08/2013, 11:54 AM

## 2013-04-28 NOTE — Progress Notes (Signed)
Clinical Social Worker will sign off for now as social work intervention is no longer needed. Please consult us again if new need arises.   Marvie Calender, MSW, LCSWA [phone] 

## 2013-04-29 ENCOUNTER — Other Ambulatory Visit: Payer: Self-pay | Admitting: Physician Assistant

## 2013-06-10 ENCOUNTER — Other Ambulatory Visit: Payer: Self-pay

## 2013-08-05 HISTORY — PX: KNEE ARTHROSCOPY: SUR90

## 2013-11-15 ENCOUNTER — Other Ambulatory Visit: Payer: Self-pay

## 2013-11-15 DIAGNOSIS — Z1231 Encounter for screening mammogram for malignant neoplasm of breast: Secondary | ICD-10-CM

## 2013-12-28 ENCOUNTER — Other Ambulatory Visit: Payer: Self-pay | Admitting: Physician Assistant

## 2013-12-29 ENCOUNTER — Ambulatory Visit
Admission: RE | Admit: 2013-12-29 | Discharge: 2013-12-29 | Disposition: A | Discharge status: Home / Self Care | Payer: BC Managed Care – PPO | Source: Ambulatory Visit

## 2013-12-29 DIAGNOSIS — Z1231 Encounter for screening mammogram for malignant neoplasm of breast: Secondary | ICD-10-CM

## 2014-12-30 ENCOUNTER — Other Ambulatory Visit: Payer: Self-pay | Admitting: Orthopedic Surgery

## 2015-01-04 NOTE — H&P (Signed)
TOTAL KNEE ADMISSION H&P  Patient is being admitted for left total knee arthroplasty.  Subjective:  Chief Complaint:left knee pain.  HPI: Jamie Espinoza, 64 y.o. female, has a history of pain and functional disability in the left knee due to arthritis and has failed non-surgical conservative treatments for greater than 12 weeks to includecorticosteriod injections, viscosupplementation injections, flexibility and strengthening excercises, weight reduction as appropriate and activity modification.  Onset of symptoms was gradual, starting 2 years ago with gradually worsening course since that time. The patient noted prior procedures on the knee to include  arthroscopy on the left knee(s).  Patient currently rates pain in the left knee(s) at 6 out of 10 with activity. Patient has worsening of pain with activity and weight bearing, pain that interferes with activities of daily living, pain with passive range of motion and joint swelling.  Patient has evidence of subchondral sclerosis, periarticular osteophytes, joint subluxation and joint space narrowing by imaging studies. This patient has had osteoarthitis. There is no active infection.  Patient Active Problem List   Diagnosis Date Noted  . Right knee DJD 04/06/2013    Class: Chronic   Past Medical History  Diagnosis Date  . Sleep apnea     uses CPAP  . Hypothyroidism   . Hypertension     Does not see cardiologist  . High cholesterol   . GERD (gastroesophageal reflux disease)   . Anxiety   . Carpal tunnel syndrome   . History of anemia     d/t heavy menstrual bleeding  . DJD (degenerative joint disease)     Past Surgical History  Procedure Laterality Date  . Carpal tunnel release Bilateral     2014  . Bunionectomy  2011    with toe fusion and screw  . Meniscus repair Right 1999  . Right ankle surgery  1969  . Tubal ligation  1981  . Tonsillectomy      as a child  . Total knee arthroplasty Right 04/07/2013    Dr Rhona Raider  . Total  knee arthroplasty Right 04/06/2013    Procedure: TOTAL KNEE ARTHROPLASTY;  Surgeon: Hessie Dibble, MD;  Location: [facility];  Service: Orthopedics;  Laterality: Right;    No prescriptions prior to admission   No Known Allergies  History  Substance Use Topics  . Smoking status: Former Smoker -- 1.00 packs/day for 20 years    Quit date: 08/05/1990  . Smokeless tobacco: Never Used  . Alcohol Use: Yes     Comment: glass of wine or beer occasionally    No family history on file.   Review of Systems  Constitutional: Negative.   HENT: Negative.   Eyes: Negative.   Respiratory: Negative.   Cardiovascular: Negative.   Gastrointestinal: Negative.   Genitourinary: Negative.   Musculoskeletal: Positive for joint pain.  Skin: Negative.   Neurological: Negative.   Endo/Heme/Allergies: Negative.   Psychiatric/Behavioral: Negative.     Objective:  Physical Exam  Constitutional: She is oriented to person, place, and time. She appears well-developed.  HENT:  Head: Atraumatic.  Eyes: EOM are normal.  Neck: Normal range of motion.  Cardiovascular: Normal rate, normal heart sounds and intact distal pulses.   Respiratory: Effort normal.  GI: Soft.  Genitourinary:  Deferred  Musculoskeletal:  Left knee pain with ROM. LLE grossly n/v intact.  Neurological: She is oriented to person, place, and time. She has normal reflexes.  Skin: Skin is warm and dry.  Psychiatric: Her behavior is normal.  Vital signs in last 24 hours:    Labs:   Estimated body mass index is 36.73 kg/(m^2) as calculated from the following:   Height as of 03/26/13: 5\' 3"  (1.6 m).   Weight as of 03/26/13: 94.031 kg (207 lb 4.8 oz).   Imaging Review Plain radiographs demonstrate severe degenerative joint disease of the right knee(s). The overall alignment ismild valgus. The bone quality appears to be good for age and reported activity level.  Assessment/Plan:  End stage arthritis, left knee   The patient  history, physical examination, clinical judgment of the provider and imaging studies are consistent with end stage degenerative joint disease of the left knee(s) and total knee arthroplasty is deemed medically necessary. The treatment options including medical management, injection therapy arthroscopy and arthroplasty were discussed at length. The risks and benefits of total knee arthroplasty were presented and reviewed. The risks due to aseptic loosening, infection, stiffness, patella tracking problems, thromboembolic complications and other imponderables were discussed. The patient acknowledged the explanation, agreed to proceed with the plan and consent was signed. Patient is being admitted for inpatient treatment for surgery, pain control, PT, OT, prophylactic antibiotics, VTE prophylaxis, progressive ambulation and ADL's and discharge planning. The patient is planning to be discharged to skilled nursing facility   Contraindications and adverse affects of Tranexamic acid discussed in detail. Patient denies any of these at this time and understands the risks and benefits.

## 2015-01-09 NOTE — Patient Instructions (Addendum)
YOUR PROCEDURE IS SCHEDULED ON : 01/19/15  REPORT TO [facility] MAIN ENTRANCE FOLLOW SIGNS TO SHORT STAY CENTER AT :  12:15 pm  CALL THIS NUMBER IF YOU HAVE PROBLEMS THE MORNING OF SURGERY [phone]  REMEMBER:  DO NOT EAT FOOD AFTER MIDNIGHT  MAY HAVE CLEAR LIQUIDS UNTIL 8:00 AM  TAKE THESE MEDICINES THE MORNING OF SURGERY:  WELLBUTRIN / LEVOTHYROXINE / FEXOFWENADINE IF NEEDED  BRING C-PAP MASK AND TUBING  YOU MAY NOT HAVE ANY METAL ON YOUR BODY INCLUDING HAIR PINS AND PIERCING'S. DO NOT WEAR JEWELRY, MAKEUP, LOTIONS, POWDERS OR PERFUMES. DO NOT WEAR NAIL POLISH. DO NOT SHAVE 48 HRS PRIOR TO SURGERY. MEN MAY SHAVE FACE AND NECK.  DO NOT [place]. [name] IS NOT RESPONSIBLE FOR VALUABLES.  CONTACTS, DENTURES OR PARTIALS MAY NOT BE WORN TO SURGERY. LEAVE SUITCASE IN CAR. CAN BE BROUGHT TO ROOM AFTER SURGERY.  PATIENTS DISCHARGED THE DAY OF SURGERY WILL NOT BE ALLOWED TO DRIVE HOME.  PLEASE READ OVER THE FOLLOWING INSTRUCTION SHEETS _________________________________________________________________________________                                          [name] - PREPARING FOR SURGERY  Before surgery, you can play an important role.  Because skin is not sterile, your skin needs to be as free of germs as possible.  You can reduce the number of germs on your skin by washing with CHG (chlorahexidine gluconate) soap before surgery.  CHG is an antiseptic cleaner which kills germs and bonds with the skin to continue killing germs even after washing. Please DO NOT use if you have an allergy to CHG or antibacterial soaps.  If your skin becomes reddened/irritated stop using the CHG and inform your nurse when you arrive at Short Stay. Do not shave (including legs and underarms) for at least 48 hours prior to the first CHG shower.  You may shave your face. Please follow these instructions carefully:   1.  Shower with CHG Soap the night before  surgery and the  morning of Surgery.   2.  If you choose to wash your hair, wash your hair first as usual with your  normal  Shampoo.   3.  After you shampoo, rinse your hair and body thoroughly to remove the  shampoo.                                         4.  Use CHG as you would any other liquid soap.  You can apply chg directly  to the skin and wash . Gently wash with scrungie or clean wascloth    5.  Apply the CHG Soap to your body ONLY FROM THE NECK DOWN.   Do not use on open                           Wound or open sores. Avoid contact with eyes, ears mouth and genitals (private parts).                        Genitals (private parts) with your normal soap.              6.  Wash thoroughly, paying special attention to the area where your surgery  will be performed.   7.  Thoroughly rinse your body with warm water from the neck down.   8.  DO NOT shower/wash with your normal soap after using and rinsing off  the CHG Soap .                9.  Pat yourself dry with a clean towel.             10.  Wear clean night clothes to bed after shower             11.  Place clean sheets on your bed the night of your first shower and do not  sleep with pets.  Day of Surgery : Do not apply any lotions/deodorants the morning of surgery.  Please wear clean clothes to the hospital/surgery center.  FAILURE TO FOLLOW THESE INSTRUCTIONS MAY RESULT IN THE CANCELLATION OF YOUR SURGERY    PATIENT SIGNATURE_________________________________  ______________________________________________________________________     Jamie Espinoza  An incentive spirometer is a tool that can help keep your lungs clear and active. This tool measures how well you are filling your lungs with each breath. Taking long deep breaths may help reverse or decrease the chance of developing breathing (pulmonary) problems (especially infection) following:  A long period of time when you are unable to move or be  active. BEFORE THE PROCEDURE   If the spirometer includes an indicator to show your best effort, your nurse or respiratory therapist will set it to a desired goal.  If possible, sit up straight or lean slightly forward. Try not to slouch.  Hold the incentive spirometer in an upright position. INSTRUCTIONS FOR USE  1. Sit on the edge of your bed if possible, or sit up as far as you can in bed or on a chair. 2. Hold the incentive spirometer in an upright position. 3. Breathe out normally. 4. Place the mouthpiece in your mouth and seal your lips tightly around it. 5. Breathe in slowly and as deeply as possible, raising the piston or the ball toward the top of the column. 6. Hold your breath for 3-5 seconds or for as long as possible. Allow the piston or ball to fall to the bottom of the column. 7. Remove the mouthpiece from your mouth and breathe out normally. 8. Rest for a few seconds and repeat Steps 1 through 7 at least 10 times every 1-2 hours when you are awake. Take your time and take a few normal breaths between deep breaths. 9. The spirometer may include an indicator to show your best effort. Use the indicator as a goal to work toward during each repetition. 10. After each set of 10 deep breaths, practice coughing to be sure your lungs are clear. If you have an incision (the cut made at the time of surgery), support your incision when coughing by placing a pillow or rolled up towels firmly against it. Once you are able to get out of bed, walk around indoors and cough well. You may stop using the incentive spirometer when instructed by your caregiver.  RISKS AND COMPLICATIONS  Take your time so you do not get dizzy or light-headed.  If you are in pain, you may need to take or ask for pain medication before doing incentive spirometry. It is harder to take a deep breath if you are having pain. AFTER USE  Rest and breathe slowly and easily.  It  can be helpful to keep track of a log of  your progress. Your caregiver can provide you with a simple table to help with this. If you are using the spirometer at home, follow these instructions: [place] IF:   You are having difficultly using the spirometer.  You have trouble using the spirometer as often as instructed.  Your pain medication is not giving enough relief while using the spirometer.  You develop fever of 100.5 F (38.1 C) or higher. SEEK IMMEDIATE MEDICAL CARE IF:   You cough up bloody sputum that had not been present before.  You develop fever of 102 F (38.9 C) or greater.  You develop worsening pain at or near the incision site. MAKE SURE YOU:   Understand these instructions.  Will watch your condition.  Will get help right away if you are not doing well or get worse. Document Released: 12/02/2006 Document Revised: 10/14/2011 Document Reviewed: 02/02/2007 Exit[employer] Patient Information 2014 [place].   ________________________________________________________________________     CLEAR LIQUID DIET   Foods Allowed                                                                     Foods Excluded  Coffee and tea, regular and decaf                             liquids that you cannot  Plain Jell-O in any flavor                                             see through such as: Fruit ices (not with fruit pulp)                                     milk, soups, orange juice  Iced Popsicles                                                 All solid food Carbonated beverages, regular and diet                                    Cranberry, grape and apple juices Sports drinks like Gatorade Lightly seasoned clear broth or consume(fat free) Sugar, honey syrup  _____________________________________________________________________

## 2015-01-10 ENCOUNTER — Encounter (HOSPITAL_COMMUNITY): Payer: Self-pay

## 2015-01-10 ENCOUNTER — Encounter (HOSPITAL_COMMUNITY)
Admission: RE | Admit: 2015-01-10 | Discharge: 2015-01-10 | Disposition: A | Discharge status: Home / Self Care | Payer: 59 | Source: Ambulatory Visit | Attending: Specialist | Admitting: Specialist

## 2015-01-10 DIAGNOSIS — Z01818 Encounter for other preprocedural examination: Secondary | ICD-10-CM | POA: Diagnosis present

## 2015-01-10 HISTORY — DX: Constipation, unspecified: K59.00

## 2015-01-10 HISTORY — DX: Personal history of other malignant neoplasm of skin: Z85.828

## 2015-01-10 HISTORY — DX: Personal history of urinary calculi: Z87.442

## 2015-01-10 HISTORY — DX: Other specified disorders of bone density and structure, unspecified site: M85.80

## 2015-01-10 LAB — URINE MICROSCOPIC-ADD ON

## 2015-01-10 LAB — CBC
HCT: 38.7 % (ref 36.0–46.0)
HEMOGLOBIN: 12.4 g/dL (ref 12.0–15.0)
MCH: 28.8 pg (ref 26.0–34.0)
MCHC: 32 g/dL (ref 30.0–36.0)
MCV: 90 fL (ref 78.0–100.0)
Platelets: 204 10*3/uL (ref 150–400)
RBC: 4.3 MIL/uL (ref 3.87–5.11)
RDW: 13.8 % (ref 11.5–15.5)
WBC: 6 10*3/uL (ref 4.0–10.5)

## 2015-01-10 LAB — BASIC METABOLIC PANEL
Anion gap: 8 (ref 5–15)
BUN: 16 mg/dL (ref 6–20)
CHLORIDE: 104 mmol/L (ref 101–111)
CO2: 30 mmol/L (ref 22–32)
Calcium: 9.5 mg/dL (ref 8.9–10.3)
Creatinine, Ser: 0.68 mg/dL (ref 0.44–1.00)
GFR calc non Af Amer: >60 mL/min (ref >60)
Glucose, Bld: 101 mg/dL — ABNORMAL HIGH (ref 65–99)
Potassium: 4.8 mmol/L (ref 3.5–5.1)
Sodium: 142 mmol/L (ref 135–145)

## 2015-01-10 LAB — URINALYSIS, ROUTINE W REFLEX MICROSCOPIC
BILIRUBIN URINE: NEGATIVE
Glucose, UA: NEGATIVE mg/dL
HGB URINE DIPSTICK: NEGATIVE
KETONES UR: NEGATIVE mg/dL
Nitrite: NEGATIVE
PH: 7.5 (ref 5.0–8.0)
Protein, ur: NEGATIVE mg/dL
Specific Gravity, Urine: 1.017 (ref 1.005–1.030)
Urobilinogen, UA: 0.2 mg/dL (ref 0.0–1.0)

## 2015-01-10 LAB — APTT: APTT: 34 s (ref 24–37)

## 2015-01-10 LAB — SURGICAL PCR SCREEN
MRSA, PCR: NEGATIVE
Staphylococcus aureus: POSITIVE — AB

## 2015-01-10 LAB — PROTIME-INR
INR: 1.04 (ref 0.00–1.49)
Prothrombin Time: 13.8 seconds (ref 11.6–15.2)

## 2015-01-10 NOTE — Progress Notes (Signed)
EKG / SLEEP STUDY / OFFICE NOTES on chart

## 2015-01-10 NOTE — Progress Notes (Signed)
Abnormal UA faxed to Dr.Collins

## 2015-01-18 ENCOUNTER — Other Ambulatory Visit: Payer: Self-pay | Admitting: Orthopedic Surgery

## 2015-01-19 ENCOUNTER — Inpatient Hospital Stay (HOSPITAL_COMMUNITY): Payer: 59

## 2015-01-19 ENCOUNTER — Encounter (HOSPITAL_COMMUNITY): Payer: Self-pay | Admitting: *Deleted

## 2015-01-19 ENCOUNTER — Inpatient Hospital Stay (HOSPITAL_COMMUNITY)
Admission: RE | Admit: 2015-01-19 | Discharge: 2015-01-21 | DRG: 470 | Disposition: A | Discharge status: Skilled Nursing Facility | Payer: 59 | Source: Ambulatory Visit | Attending: Specialist | Admitting: Specialist

## 2015-01-19 ENCOUNTER — Inpatient Hospital Stay (HOSPITAL_COMMUNITY): Payer: 59 | Admitting: Anesthesiology

## 2015-01-19 ENCOUNTER — Encounter (HOSPITAL_COMMUNITY)
Admission: RE | Disposition: A | Discharge status: Skilled Nursing Facility | Payer: Self-pay | Source: Ambulatory Visit | Attending: Specialist

## 2015-01-19 DIAGNOSIS — E039 Hypothyroidism, unspecified: Secondary | ICD-10-CM | POA: Diagnosis present

## 2015-01-19 DIAGNOSIS — Z96651 Presence of right artificial knee joint: Secondary | ICD-10-CM | POA: Diagnosis present

## 2015-01-19 DIAGNOSIS — M1712 Unilateral primary osteoarthritis, left knee: Secondary | ICD-10-CM | POA: Diagnosis present

## 2015-01-19 DIAGNOSIS — Z96659 Presence of unspecified artificial knee joint: Secondary | ICD-10-CM

## 2015-01-19 DIAGNOSIS — Z87891 Personal history of nicotine dependence: Secondary | ICD-10-CM

## 2015-01-19 DIAGNOSIS — E780 Pure hypercholesterolemia: Secondary | ICD-10-CM | POA: Diagnosis present

## 2015-01-19 DIAGNOSIS — M1990 Unspecified osteoarthritis, unspecified site: Secondary | ICD-10-CM

## 2015-01-19 DIAGNOSIS — Z6836 Body mass index (BMI) 36.0-36.9, adult: Secondary | ICD-10-CM | POA: Diagnosis not present

## 2015-01-19 DIAGNOSIS — Z01812 Encounter for preprocedural laboratory examination: Secondary | ICD-10-CM

## 2015-01-19 DIAGNOSIS — M25562 Pain in left knee: Secondary | ICD-10-CM | POA: Diagnosis present

## 2015-01-19 DIAGNOSIS — K219 Gastro-esophageal reflux disease without esophagitis: Secondary | ICD-10-CM | POA: Diagnosis present

## 2015-01-19 DIAGNOSIS — G4730 Sleep apnea, unspecified: Secondary | ICD-10-CM | POA: Diagnosis present

## 2015-01-19 DIAGNOSIS — I10 Essential (primary) hypertension: Secondary | ICD-10-CM | POA: Diagnosis present

## 2015-01-19 HISTORY — PX: TOTAL KNEE ARTHROPLASTY: SHX125

## 2015-01-19 LAB — TYPE AND SCREEN
ABO/RH(D): O POS
Antibody Screen: NEGATIVE

## 2015-01-19 SURGERY — ARTHROPLASTY, KNEE, TOTAL
Anesthesia: Spinal | Site: Knee | Laterality: Left

## 2015-01-19 MED ORDER — TRANEXAMIC ACID 1000 MG/10ML IV SOLN
1000.0000 mg | INTRAVENOUS | Status: AC
  Administered 2015-01-19: 1000 mg via INTRAVENOUS
  Filled 2015-01-19: qty 10

## 2015-01-19 MED ORDER — PROPOFOL 10 MG/ML IV BOLUS
INTRAVENOUS | Status: AC
  Filled 2015-01-19: qty 20

## 2015-01-19 MED ORDER — PANTOPRAZOLE SODIUM 40 MG PO TBEC
40.0000 mg | DELAYED_RELEASE_TABLET | Freq: Every day | ORAL | Status: DC
  Administered 2015-01-19 – 2015-01-21 (×3): 40 mg via ORAL
  Filled 2015-01-19 (×3): qty 1

## 2015-01-19 MED ORDER — ZOLPIDEM TARTRATE 5 MG PO TABS: 5.0000 mg | ORAL_TABLET | Freq: Every evening | ORAL | Status: DC | PRN

## 2015-01-19 MED ORDER — POLYETHYLENE GLYCOL 3350 17 G PO PACK: 17.0000 g | PACK | Freq: Every day | ORAL | Status: DC | PRN

## 2015-01-19 MED ORDER — ACETAMINOPHEN 325 MG PO TABS: 650.0000 mg | ORAL_TABLET | Freq: Four times a day (QID) | ORAL | Status: DC | PRN

## 2015-01-19 MED ORDER — DEXAMETHASONE SODIUM PHOSPHATE 10 MG/ML IJ SOLN
INTRAMUSCULAR | Status: AC
  Filled 2015-01-19: qty 1

## 2015-01-19 MED ORDER — ONDANSETRON HCL 4 MG PO TABS: 4.0000 mg | ORAL_TABLET | Freq: Four times a day (QID) | ORAL | Status: DC | PRN

## 2015-01-19 MED ORDER — CEFAZOLIN SODIUM-DEXTROSE 2-3 GM-% IV SOLR
2.0000 g | INTRAVENOUS | Status: AC
  Administered 2015-01-19: 2 g via INTRAVENOUS

## 2015-01-19 MED ORDER — CEFAZOLIN SODIUM-DEXTROSE 2-3 GM-% IV SOLR
2.0000 g | Freq: Four times a day (QID) | INTRAVENOUS | Status: AC
  Administered 2015-01-19 – 2015-01-20 (×2): 2 g via INTRAVENOUS
  Filled 2015-01-19 (×2): qty 50

## 2015-01-19 MED ORDER — MIDAZOLAM HCL 2 MG/2ML IJ SOLN
INTRAMUSCULAR | Status: AC
  Filled 2015-01-19: qty 2

## 2015-01-19 MED ORDER — PHENOL 1.4 % MT LIQD
1.0000 | OROMUCOSAL | Status: DC | PRN
  Filled 2015-01-19: qty 177

## 2015-01-19 MED ORDER — METOCLOPRAMIDE HCL 5 MG/ML IJ SOLN
5.0000 mg | Freq: Three times a day (TID) | INTRAMUSCULAR | Status: DC | PRN
Start: 2015-01-19 — End: 2015-01-21

## 2015-01-19 MED ORDER — MEPERIDINE HCL 50 MG/ML IJ SOLN: 6.2500 mg | INTRAMUSCULAR | Status: DC | PRN

## 2015-01-19 MED ORDER — BISACODYL 5 MG PO TBEC: 5.0000 mg | DELAYED_RELEASE_TABLET | Freq: Every day | ORAL | Status: DC | PRN

## 2015-01-19 MED ORDER — CEFAZOLIN SODIUM-DEXTROSE 2-3 GM-% IV SOLR
INTRAVENOUS | Status: AC
  Filled 2015-01-19: qty 50

## 2015-01-19 MED ORDER — HYDROMORPHONE HCL 1 MG/ML IJ SOLN
0.2500 mg | INTRAMUSCULAR | Status: DC | PRN
  Administered 2015-01-19 (×2): 0.5 mg via INTRAVENOUS

## 2015-01-19 MED ORDER — ACETAMINOPHEN 650 MG RE SUPP: 650.0000 mg | Freq: Four times a day (QID) | RECTAL | Status: DC | PRN

## 2015-01-19 MED ORDER — CELECOXIB 200 MG PO CAPS
200.0000 mg | ORAL_CAPSULE | Freq: Every day | ORAL | Status: DC
  Administered 2015-01-19 – 2015-01-21 (×3): 200 mg via ORAL
  Filled 2015-01-19 (×3): qty 1

## 2015-01-19 MED ORDER — STERILE WATER FOR IRRIGATION IR SOLN
Status: DC | PRN
  Administered 2015-01-19: 1500 mL

## 2015-01-19 MED ORDER — ENOXAPARIN SODIUM 30 MG/0.3ML ~~LOC~~ SOLN
30.0000 mg | Freq: Two times a day (BID) | SUBCUTANEOUS | Status: DC
  Administered 2015-01-20 – 2015-01-21 (×3): 30 mg via SUBCUTANEOUS
  Filled 2015-01-19 (×5): qty 0.3

## 2015-01-19 MED ORDER — METOCLOPRAMIDE HCL 10 MG PO TABS: 5.0000 mg | ORAL_TABLET | Freq: Three times a day (TID) | ORAL | Status: DC | PRN

## 2015-01-19 MED ORDER — FENTANYL CITRATE (PF) 100 MCG/2ML IJ SOLN
INTRAMUSCULAR | Status: AC
  Filled 2015-01-19: qty 2

## 2015-01-19 MED ORDER — DEXAMETHASONE SODIUM PHOSPHATE 10 MG/ML IJ SOLN
10.0000 mg | Freq: Once | INTRAMUSCULAR | Status: AC
  Administered 2015-01-20: 10 mg via INTRAVENOUS
  Filled 2015-01-19: qty 1

## 2015-01-19 MED ORDER — ALUM & MAG HYDROXIDE-SIMETH 200-200-20 MG/5ML PO SUSP: 30.0000 mL | ORAL | Status: DC | PRN

## 2015-01-19 MED ORDER — 0.9 % SODIUM CHLORIDE (POUR BTL) OPTIME
TOPICAL | Status: DC | PRN
  Administered 2015-01-19: 1000 mL

## 2015-01-19 MED ORDER — FLEET ENEMA 7-19 GM/118ML RE ENEM: 1.0000 | ENEMA | Freq: Once | RECTAL | Status: AC | PRN

## 2015-01-19 MED ORDER — DEXAMETHASONE SODIUM PHOSPHATE 10 MG/ML IJ SOLN
10.0000 mg | Freq: Once | INTRAMUSCULAR | Status: AC
  Administered 2015-01-19: 10 mg via INTRAVENOUS

## 2015-01-19 MED ORDER — NAPHAZOLINE HCL 0.1 % OP SOLN
2.0000 [drp] | Freq: Four times a day (QID) | OPHTHALMIC | Status: DC | PRN
  Filled 2015-01-19: qty 15

## 2015-01-19 MED ORDER — PROMETHAZINE HCL 25 MG/ML IJ SOLN
6.2500 mg | INTRAMUSCULAR | Status: DC | PRN
Start: 2015-01-19 — End: 2015-01-19

## 2015-01-19 MED ORDER — ONDANSETRON HCL 4 MG/2ML IJ SOLN: 4.0000 mg | Freq: Four times a day (QID) | INTRAMUSCULAR | Status: DC | PRN

## 2015-01-19 MED ORDER — METHOCARBAMOL 500 MG PO TABS
500.0000 mg | ORAL_TABLET | Freq: Four times a day (QID) | ORAL | Status: DC | PRN
  Administered 2015-01-20 – 2015-01-21 (×5): 500 mg via ORAL
  Filled 2015-01-19 (×5): qty 1

## 2015-01-19 MED ORDER — LOSARTAN POTASSIUM 50 MG PO TABS
50.0000 mg | ORAL_TABLET | Freq: Every day | ORAL | Status: DC
  Administered 2015-01-19 – 2015-01-20 (×2): 50 mg via ORAL
  Filled 2015-01-19 (×3): qty 1

## 2015-01-19 MED ORDER — FERROUS SULFATE 325 (65 FE) MG PO TABS
325.0000 mg | ORAL_TABLET | Freq: Three times a day (TID) | ORAL | Status: DC
  Administered 2015-01-19 – 2015-01-21 (×4): 325 mg via ORAL
  Filled 2015-01-19 (×8): qty 1

## 2015-01-19 MED ORDER — PROPOFOL INFUSION 10 MG/ML OPTIME
INTRAVENOUS | Status: DC | PRN
  Administered 2015-01-19: 100 ug/kg/min via INTRAVENOUS

## 2015-01-19 MED ORDER — LEVOTHYROXINE SODIUM 75 MCG PO TABS
75.0000 ug | ORAL_TABLET | Freq: Every day | ORAL | Status: DC
  Administered 2015-01-19 – 2015-01-20 (×3): 75 ug via ORAL
  Filled 2015-01-19 (×3): qty 1

## 2015-01-19 MED ORDER — SODIUM CHLORIDE 0.9 % IR SOLN
Status: DC | PRN
  Administered 2015-01-19: 1000 mL

## 2015-01-19 MED ORDER — HYDROMORPHONE HCL 1 MG/ML IJ SOLN
INTRAMUSCULAR | Status: AC
  Filled 2015-01-19: qty 1

## 2015-01-19 MED ORDER — BUPIVACAINE-EPINEPHRINE (PF) 0.5% -1:200000 IJ SOLN
INTRAMUSCULAR | Status: DC | PRN
  Administered 2015-01-19: 30 mL via PERINEURAL

## 2015-01-19 MED ORDER — BUPIVACAINE-EPINEPHRINE 0.25% -1:200000 IJ SOLN
INTRAMUSCULAR | Status: DC | PRN
  Administered 2015-01-19: 30 mL

## 2015-01-19 MED ORDER — SODIUM CHLORIDE 0.9 % IJ SOLN
INTRAMUSCULAR | Status: AC
  Filled 2015-01-19: qty 50

## 2015-01-19 MED ORDER — HYDROMORPHONE HCL 1 MG/ML IJ SOLN
1.0000 mg | INTRAMUSCULAR | Status: DC | PRN
  Administered 2015-01-19: 1 mg via INTRAVENOUS
  Filled 2015-01-19: qty 1

## 2015-01-19 MED ORDER — FENTANYL CITRATE (PF) 100 MCG/2ML IJ SOLN
INTRAMUSCULAR | Status: DC | PRN
  Administered 2015-01-19 (×2): 50 ug via INTRAVENOUS
  Administered 2015-01-19: 100 ug via INTRAVENOUS

## 2015-01-19 MED ORDER — MIDAZOLAM HCL 5 MG/5ML IJ SOLN
INTRAMUSCULAR | Status: DC | PRN
Start: 2015-01-19 — End: 2015-01-19
  Administered 2015-01-19: 2 mg via INTRAVENOUS

## 2015-01-19 MED ORDER — DOCUSATE SODIUM 100 MG PO CAPS
100.0000 mg | ORAL_CAPSULE | Freq: Two times a day (BID) | ORAL | Status: DC
  Administered 2015-01-19 – 2015-01-21 (×4): 100 mg via ORAL

## 2015-01-19 MED ORDER — DIPHENHYDRAMINE HCL 12.5 MG/5ML PO ELIX: 12.5000 mg | ORAL_SOLUTION | ORAL | Status: DC | PRN

## 2015-01-19 MED ORDER — POVIDONE-IODINE 7.5 % EX SOLN: Freq: Once | CUTANEOUS | Status: DC

## 2015-01-19 MED ORDER — KETOROLAC TROMETHAMINE 30 MG/ML IJ SOLN
INTRAMUSCULAR | Status: AC
  Filled 2015-01-19: qty 1

## 2015-01-19 MED ORDER — LIDOCAINE HCL (CARDIAC) 20 MG/ML IV SOLN
INTRAVENOUS | Status: AC
  Filled 2015-01-19: qty 5

## 2015-01-19 MED ORDER — ONDANSETRON HCL 4 MG/2ML IJ SOLN
INTRAMUSCULAR | Status: DC | PRN
  Administered 2015-01-19: 4 mg via INTRAVENOUS

## 2015-01-19 MED ORDER — BUPIVACAINE-EPINEPHRINE (PF) 0.25% -1:200000 IJ SOLN
INTRAMUSCULAR | Status: AC
  Filled 2015-01-19: qty 30

## 2015-01-19 MED ORDER — BUPROPION HCL ER (XL) 300 MG PO TB24
300.0000 mg | ORAL_TABLET | Freq: Every day | ORAL | Status: DC
Start: 2015-01-20 — End: 2015-01-21
  Administered 2015-01-20 – 2015-01-21 (×2): 300 mg via ORAL
  Filled 2015-01-19 (×2): qty 1

## 2015-01-19 MED ORDER — LACTATED RINGERS IV SOLN
INTRAVENOUS | Status: DC
  Administered 2015-01-19: 18:00:00 via INTRAVENOUS
  Administered 2015-01-19: 1000 mL via INTRAVENOUS
  Administered 2015-01-19: 16:00:00 via INTRAVENOUS

## 2015-01-19 MED ORDER — SODIUM CHLORIDE 0.9 % IV SOLN: INTRAVENOUS | Status: DC

## 2015-01-19 MED ORDER — LOSARTAN POTASSIUM-HCTZ 50-12.5 MG PO TABS: 1.0000 | ORAL_TABLET | Freq: Every day | ORAL | Status: DC

## 2015-01-19 MED ORDER — PROPOFOL 10 MG/ML IV BOLUS
INTRAVENOUS | Status: DC | PRN
  Administered 2015-01-19: 20 mg via INTRAVENOUS

## 2015-01-19 MED ORDER — OXYCODONE HCL 5 MG PO TABS
5.0000 mg | ORAL_TABLET | ORAL | Status: DC | PRN
  Administered 2015-01-20 – 2015-01-21 (×8): 10 mg via ORAL
  Administered 2015-01-21: 5 mg via ORAL
  Filled 2015-01-19 (×7): qty 2
  Filled 2015-01-19: qty 1
  Filled 2015-01-19: qty 2
  Filled 2015-01-19: qty 1

## 2015-01-19 MED ORDER — KETOROLAC TROMETHAMINE 30 MG/ML IJ SOLN
INTRAMUSCULAR | Status: DC | PRN
  Administered 2015-01-19: 30 mg via INTRAVENOUS

## 2015-01-19 MED ORDER — BUPIVACAINE IN DEXTROSE 0.75-8.25 % IT SOLN
INTRATHECAL | Status: DC | PRN
  Administered 2015-01-19: 13.5 mg via INTRATHECAL

## 2015-01-19 MED ORDER — POTASSIUM CHLORIDE IN NACL 20-0.9 MEQ/L-% IV SOLN
INTRAVENOUS | Status: DC
  Administered 2015-01-19: 20:00:00 via INTRAVENOUS
  Filled 2015-01-19 (×4): qty 1000

## 2015-01-19 MED ORDER — BUPIVACAINE-EPINEPHRINE (PF) 0.5% -1:200000 IJ SOLN
INTRAMUSCULAR | Status: AC
  Filled 2015-01-19: qty 30

## 2015-01-19 MED ORDER — SIMVASTATIN 20 MG PO TABS
20.0000 mg | ORAL_TABLET | Freq: Every day | ORAL | Status: DC
  Administered 2015-01-19 – 2015-01-21 (×3): 20 mg via ORAL
  Filled 2015-01-19 (×3): qty 1

## 2015-01-19 MED ORDER — HYDROCHLOROTHIAZIDE 12.5 MG PO CAPS
12.5000 mg | ORAL_CAPSULE | Freq: Every day | ORAL | Status: DC
  Administered 2015-01-19 – 2015-01-20 (×2): 12.5 mg via ORAL
  Filled 2015-01-19 (×3): qty 1

## 2015-01-19 MED ORDER — MENTHOL 3 MG MT LOZG: 1.0000 | LOZENGE | OROMUCOSAL | Status: DC | PRN

## 2015-01-19 MED ORDER — MIDAZOLAM HCL 2 MG/2ML IJ SOLN: 0.5000 mg | Freq: Once | INTRAMUSCULAR | Status: DC | PRN

## 2015-01-19 MED ORDER — METHOCARBAMOL 1000 MG/10ML IJ SOLN
500.0000 mg | Freq: Four times a day (QID) | INTRAVENOUS | Status: DC | PRN
  Administered 2015-01-19: 500 mg via INTRAVENOUS
  Filled 2015-01-19 (×2): qty 5

## 2015-01-19 MED ORDER — SODIUM CHLORIDE 0.9 % IJ SOLN
INTRAMUSCULAR | Status: DC | PRN
  Administered 2015-01-19: 30 mL

## 2015-01-19 SURGICAL SUPPLY — 66 items
BAG SPEC THK2 15X12 ZIP CLS (MISCELLANEOUS) ×2
BAG ZIPLOCK 12X15 (MISCELLANEOUS) ×6 IMPLANT
BANDAGE ELASTIC 4 VELCRO ST LF (GAUZE/BANDAGES/DRESSINGS) ×3 IMPLANT
BANDAGE ELASTIC 6 VELCRO ST LF (GAUZE/BANDAGES/DRESSINGS) ×3 IMPLANT
BANDAGE ESMARK 6X9 LF (GAUZE/BANDAGES/DRESSINGS) ×1 IMPLANT
BLADE SAG 18X100X1.27 (BLADE) ×3 IMPLANT
BLADE SAW SGTL 13.0X1.19X90.0M (BLADE) ×3 IMPLANT
BNDG CMPR 9X6 STRL LF SNTH (GAUZE/BANDAGES/DRESSINGS) ×1
BNDG ESMARK 6X9 LF (GAUZE/BANDAGES/DRESSINGS) ×3
CAP KNEE TOTAL 3 SIGMA ×2 IMPLANT
CEMENT HV SMART SET (Cement) ×4 IMPLANT
CUFF TOURN SGL QUICK 34 (TOURNIQUET CUFF) ×3
CUFF TRNQT CYL 34X4X40X1 (TOURNIQUET CUFF) ×1 IMPLANT
DRAPE EXTREMITY T 121X128X90 (DRAPE) ×3 IMPLANT
DRAPE POUCH INSTRU U-SHP 10X18 (DRAPES) ×3 IMPLANT
DRAPE SHEET LG 3/4 BI-LAMINATE (DRAPES) ×3 IMPLANT
DRAPE U-SHAPE 47X51 STRL (DRAPES) ×3 IMPLANT
DRSG AQUACEL AG ADV 3.5X10 (GAUZE/BANDAGES/DRESSINGS) ×3 IMPLANT
DRSG AQUACEL AG ADV 3.5X14 (GAUZE/BANDAGES/DRESSINGS) ×2 IMPLANT
DRSG TEGADERM 4X4.75 (GAUZE/BANDAGES/DRESSINGS) ×3 IMPLANT
DURAPREP 26ML APPLICATOR (WOUND CARE) ×3 IMPLANT
ELECT REM PT RETURN 9FT ADLT (ELECTROSURGICAL) ×3
ELECTRODE REM PT RTRN 9FT ADLT (ELECTROSURGICAL) ×1 IMPLANT
EVACUATOR 1/8 PVC DRAIN (DRAIN) ×3 IMPLANT
FACESHIELD WRAPAROUND (MASK) ×15 IMPLANT
FACESHIELD WRAPAROUND OR TEAM (MASK) ×5 IMPLANT
GAUZE SPONGE 2X2 8PLY STRL LF (GAUZE/BANDAGES/DRESSINGS) ×1 IMPLANT
GLOVE BIOGEL PI IND STRL 8 (GLOVE) ×2 IMPLANT
GLOVE BIOGEL PI INDICATOR 8 (GLOVE) ×4
GLOVE SURG ORTHO 8.0 STRL STRW (GLOVE) ×3 IMPLANT
GLOVE SURG ORTHO 9.0 STRL STRW (GLOVE) ×3 IMPLANT
GLOVE SURG SS PI 7.5 STRL IVOR (GLOVE) ×3 IMPLANT
GOWN STRL REUS W/TWL XL LVL3 (GOWN DISPOSABLE) ×6 IMPLANT
HANDPIECE INTERPULSE COAX TIP (DISPOSABLE) ×3
IMMOBILIZER KNEE 20 (SOFTGOODS) ×3
IMMOBILIZER KNEE 20 THIGH 36 (SOFTGOODS) ×1 IMPLANT
KIT BASIN OR (CUSTOM PROCEDURE TRAY) ×3 IMPLANT
LIQUID BAND (GAUZE/BANDAGES/DRESSINGS) ×3 IMPLANT
NDL SAFETY ECLIPSE 18X1.5 (NEEDLE) ×1 IMPLANT
NEEDLE HYPO 18GX1.5 SHARP (NEEDLE) ×3
NS IRRIG 1000ML POUR BTL (IV SOLUTION) ×3 IMPLANT
PACK TOTAL JOINT (CUSTOM PROCEDURE TRAY) ×3 IMPLANT
POSITIONER SURGICAL ARM (MISCELLANEOUS) ×3 IMPLANT
SET HNDPC FAN SPRY TIP SCT (DISPOSABLE) ×1 IMPLANT
SET PAD KNEE POSITIONER (MISCELLANEOUS) ×3 IMPLANT
SPONGE GAUZE 2X2 STER 10/PKG (GAUZE/BANDAGES/DRESSINGS) ×2
SPONGE LAP 18X18 X RAY DECT (DISPOSABLE) IMPLANT
SPONGE SURGIFOAM ABS GEL 100 (HEMOSTASIS) ×2 IMPLANT
STOCKINETTE 6  STRL (DRAPES) ×2
STOCKINETTE 6 STRL (DRAPES) ×1 IMPLANT
SUCTION FRAZIER 12FR DISP (SUCTIONS) ×3 IMPLANT
SUT BONE WAX W31G (SUTURE) IMPLANT
SUT MNCRL AB 3-0 PS2 18 (SUTURE) ×3 IMPLANT
SUT VIC AB 1 CT1 27 (SUTURE) ×12
SUT VIC AB 1 CT1 27XBRD ANTBC (SUTURE) ×4 IMPLANT
SUT VIC AB 2-0 CT1 27 (SUTURE) ×6
SUT VIC AB 2-0 CT1 TAPERPNT 27 (SUTURE) ×2 IMPLANT
SUT VLOC 180 0 24IN GS25 (SUTURE) ×3 IMPLANT
SYR 50ML LL SCALE MARK (SYRINGE) ×3 IMPLANT
TAPE STRIPS DRAPE STRL (GAUZE/BANDAGES/DRESSINGS) ×3 IMPLANT
TOWEL OR 17X26 10 PK STRL BLUE (TOWEL DISPOSABLE) ×3 IMPLANT
TOWEL OR NON WOVEN STRL DISP B (DISPOSABLE) IMPLANT
TOWER CARTRIDGE SMART MIX (DISPOSABLE) ×3 IMPLANT
TRAY FOLEY W/METER SILVER 14FR (SET/KITS/TRAYS/PACK) ×3 IMPLANT
WATER STERILE IRR 1500ML POUR (IV SOLUTION) ×6 IMPLANT
WRAP KNEE MAXI GEL POST OP (GAUZE/BANDAGES/DRESSINGS) ×3 IMPLANT

## 2015-01-19 NOTE — Anesthesia Postprocedure Evaluation (Signed)
  Anesthesia Post-op Note  Patient: Jamie Espinoza  Procedure(s) Performed: Procedure(s) (LRB): LEFT TOTAL KNEE ARTHROPLASTY (Left)  Patient Location: PACU  Anesthesia Type: Spinal  Level of Consciousness: awake and alert   Airway and Oxygen Therapy: Patient Spontanous Breathing  Post-op Pain: mild  Post-op Assessment: Post-op Vital signs reviewed, Patient's Cardiovascular Status Stable, Respiratory Function Stable, Patent Airway and No signs of Nausea or vomiting  Last Vitals:  Filed Vitals:   01/19/15 1841  BP: 142/80  Pulse: 84  Temp: 36.6 C  Resp: 16    Post-op Vital Signs: stable   Complications: No apparent anesthesia complications

## 2015-01-19 NOTE — Progress Notes (Signed)
AssistedDr. Glennon Mac with left femoral nerve block. Side rails up, monitors on throughout procedure. See vital signs in flow sheet. Tolerated Procedure well.

## 2015-01-19 NOTE — Op Note (Signed)
DATE OF SURGERY:  01/19/2015  TIME: 5:34 PM  PATIENT NAME:  Jamie Espinoza    AGE: 64 y.o.   PRE-OPERATIVE DIAGNOSIS:  left knee osteoarthritis  POST-OPERATIVE DIAGNOSIS:  left knee osteoarthritis  PROCEDURE:  Procedure(s): LEFT TOTAL KNEE ARTHROPLASTY  SURGEON:  Orilla Templeman ANDREW  ASSISTANT:  Bryson Stilwell, PA-C, present and scrubbed throughout the case, critical for assistance with exposure, retraction, instrumentation, and closure.  OPERATIVE IMPLANTS: Depuy PFC Sigma Rotating Platform.  Femur size 3, Tibia size 3, Patella size 35 3-peg oval button, with a 10 mm polyethylene insert.   PREOPERATIVE INDICATIONS:   Jamie Espinoza is a 64 y.o. year old female with end stage bone on bone arthritis of the knee who failed conservative treatment and elected for Total Knee Arthroplasty.   The risks, benefits, and alternatives were discussed at length including but not limited to the risks of infection, bleeding, nerve injury, stiffness, blood clots, the need for revision surgery, cardiopulmonary complications, among others, and they were willing to proceed.  OPERATIVE DESCRIPTION:  The patient was brought to the operative room and placed in a supine position.  Spinal anesthesia was administered.  IV antibiotics were given.  The lower extremity was prepped and draped in the usual sterile fashion.  Time out was performed.  The leg was elevated and exsanguinated and the tourniquet was inflated.  Anterior quadriceps tendon splitting approach was performed.  The patella was retracted and osteophytes were removed.  The anterior horn of the medial and lateral meniscus was removed and cruciate ligaments resected.   The distal femur was opened with the drill and the intramedullary distal femoral cutting jig was utilized, set at 5 degrees resecting 10 mm off the distal femur.  Care was taken to protect the collateral ligaments.  The distal femoral sizing jig was applied, taking care to avoid  notching.  Then the 4-in-1 cutting jig was applied and the anterior and posterior femur was cut, along with the chamfer cuts.    Then the extramedullary tibial cutting jig was utilized making the appropriate cut using the anterior tibial crest as a reference building in appropriate posterior slope.  Care was taken during the cut to protect the medial and collateral ligaments.  The proximal tibia was removed along with the posterior horns of the menisci.   The posterior medial femoral osteophytes and posterior lateral femoral osteophytes were removed.    The flexion gap was then measured and was symmetric with the extension gap, measured at 10.  I completed the distal femoral preparation using the appropriate jig to prepare the box.  The patella was then measured, and cut with the saw.    The proximal tibia sized and prepared accordingly with the reamer and the punch, and then all components were trialed with the trial insert.  The knee was found to have excellent balance and full motion.    The above named components were then cemented into place and all excess cement was removed.  The trial polyethylene component was in place during cementation, and then was exchanged for the real polyethylene component.    The knee was easily taken through a range of motion and the patella tracked well and the knee irrigated copiously and the parapatellar and subcutaneous tissue closed with vicryl, and monocryl with steri strips for the skin.  The arthrotomy was closed at 90 of flexion. The wounds were dressed with sterile gauze and the tourniquet released and the patient was awakened and returned to the PACU  in stable and satisfactory condition.  There were no complications.  Total tourniquet time was 90 minutes.Vloc closure  60ccsaline toradol and marcaine periosteal injection.               DATE OF SURGERY:  01/19/2015  TIME: 5:34 PM  PATIENT NAME:  Jamie Espinoza    AGE: 64 y.o.    PRE-OPERATIVE DIAGNOSIS:  left knee osteoarthritis  POST-OPERATIVE DIAGNOSIS:  left knee osteoarthritis  PROCEDURE:  Procedure(s): LEFT TOTAL KNEE ARTHROPLASTY  SURGEON:  Mariana Goytia ANDREW  ASSISTANT:  Bryson Stilwell, PA-C, present and scrubbed throughout the case, critical for assistance with exposure, retraction, instrumentation, and closure.  OPERATIVE IMPLANTS: Depuy PFC Sigma Rotating Platform.  Femur size 3, Tibia size 3, Patella size 35 3-peg oval button, with a 10 mm polyethylene insert.   PREOPERATIVE INDICATIONS:   Jamie Espinoza is a 64 y.o. year old female with end stage bone on bone arthritis of the knee who failed conservative treatment and elected for Total Knee Arthroplasty.   The risks, benefits, and alternatives were discussed at length including but not limited to the risks of infection, bleeding, nerve injury, stiffness, blood clots, the need for revision surgery, cardiopulmonary complications, among others, and they were willing to proceed.  OPERATIVE DESCRIPTION:  The patient was brought to the operative room and placed in a supine position.  Spinal anesthesia was administered.  IV antibiotics were given.  The lower extremity was prepped and draped in the usual sterile fashion.  Time out was performed.  The leg was elevated and exsanguinated and the tourniquet was inflated.  Anterior quadriceps tendon splitting approach was performed.  The patella was retracted and osteophytes were removed.  The anterior horn of the medial and lateral meniscus was removed and cruciate ligaments resected.   The distal femur was opened with the drill and the intramedullary distal femoral cutting jig was utilized, set at 5 degrees resecting 10 mm off the distal femur.  Care was taken to protect the collateral ligaments.  The distal femoral sizing jig was applied, taking care to avoid notching.  Then the 4-in-1 cutting jig was applied and the anterior and posterior femur was cut,  along with the chamfer cuts.    Then the extramedullary tibial cutting jig was utilized making the appropriate cut using the anterior tibial crest as a reference building in appropriate posterior slope.  Care was taken during the cut to protect the medial and collateral ligaments.  The proximal tibia was removed along with the posterior horns of the menisci.   The posterior medial femoral osteophytes and posterior lateral femoral osteophytes were removed.    The flexion gap was then measured and was symmetric with the extension gap, measured at 10.  I completed the distal femoral preparation using the appropriate jig to prepare the box.  The patella was then measured, and cut with the saw.    The proximal tibia sized and prepared accordingly with the reamer and the punch, and then all components were trialed with the trial insert.  The knee was found to have excellent balance and full motion.    The above named components were then cemented into place and all excess cement was removed.  The trial polyethylene component was in place during cementation, and then was exchanged for the real polyethylene component.    The knee was easily taken through a range of motion and the patella tracked well and the knee irrigated copiously and the parapatellar and subcutaneous tissue closed  with vicryl, and monocryl with steri strips for the skin.  The arthrotomy was closed at 90 of flexion. The wounds were dressed with sterile gauze and the tourniquet released and the patient was awakened and returned to the PACU in stable and satisfactory condition.  There were no complications.  Total tourniquet time was 90 minutes.

## 2015-01-19 NOTE — Interval H&P Note (Signed)
History and Physical Interval Note:  01/19/2015 3:15 PM  Jamie Espinoza  has presented today for surgery, with the diagnosis of left knee osteoarthritis  The various methods of treatment have been discussed with the patient and family. After consideration of risks, benefits and other options for treatment, the patient has consented to  Procedure(s): LEFT TOTAL KNEE ARTHROPLASTY (Left) as a surgical intervention .  The patient's history has been reviewed, patient examined, no change in status, stable for surgery.  I have reviewed the patient's chart and labs.  Questions were answered to the patient's satisfaction.     [name] ANDREW

## 2015-01-19 NOTE — Anesthesia Procedure Notes (Addendum)
Anesthesia Regional Block:  Adductor canal block  Pre-Anesthetic Checklist: ,, timeout performed, Correct Patient, Correct Site, Correct Laterality, Correct Procedure, Correct Position, site marked, Risks and benefits discussed,  Surgical consent,  Pre-op evaluation,  At surgeon's request and post-op pain management  Laterality: Left and Lower  Prep: chloraprep       Needles:  Injection technique: Single-shot  Needle Type: Echogenic Stimulator Needle     Needle Length: 9cm 9 cm Needle Gauge: 22 and 22 G    Additional Needles:  Procedures: ultrasound guided (picture in chart) and nerve stimulator Adductor canal block  Nerve Stimulator or Paresthesia:  Response: patella twitch, 1 mA, 0.1 ms,   Additional Responses:   Narrative:  Start time: 01/19/2015 2:29 PM End time: 01/19/2015 2:37 PM Injection made incrementally with aspirations every 5 mL.  Performed by: Personally  Anesthesiologist: Glennon Mac, CARSWELL  Additional Notes: Pt identified in Holding room.  Monitors applied. Working IV access confirmed. Sterile prep and drape L thigh.  #22ga ECHOgenic PNS into adductor canal with US guidance, +patellar twitch.  30cc 0.5% Bupivacaine with 1:200k epi injected incrementally after negative test dose, good spread around nerve. Patient asymptomatic, no heme aspirated, patient tolerated well.  Jenita Seashore, MD   Spinal Patient location during procedure: OR Start time: 01/19/2015 3:22 PM End time: 01/19/2015 3:28 PM Staffing Anesthesiologist: Annye Asa Resident/CRNA: Darlys Gales R Performed by: resident/CRNA  Preanesthetic Checklist Completed: patient identified, site marked, surgical consent, pre-op evaluation, timeout performed, IV checked, risks and benefits discussed and monitors and equipment checked Spinal Block Patient position: sitting Prep: Betadine Patient monitoring: heart rate, continuous pulse ox and blood pressure Location: L3-4 Injection technique:  single-shot Needle Needle type: Spinocan  Needle gauge: 22 G Needle length: 9 cm Needle insertion depth: 8 cm Assessment Sensory level: T6 Additional Notes Expiration date of kit checked and confirmed. Patient tolerated procedure well, without complications.

## 2015-01-19 NOTE — Anesthesia Preprocedure Evaluation (Addendum)
Anesthesia Evaluation  Patient identified by MRN, date of birth, ID band Patient awake    Reviewed: Allergy & Precautions, NPO status , Patient's Chart, lab work & pertinent test results  History of Anesthesia Complications Negative for: history of anesthetic complications  Airway Mallampati: II  TM Distance: >3 FB Neck ROM: Full    Dental  (+) Dental Advisory Given   Pulmonary sleep apnea and Continuous Positive Airway Pressure Ventilation , former smoker,  breath sounds clear to auscultation        Cardiovascular hypertension, Pt. on medications - anginaRhythm:Regular Rate:Normal     Neuro/Psych Anxiety negative neurological ROS     GI/Hepatic Neg liver ROS, GERD-  Medicated and Controlled,  Endo/Other  Hypothyroidism Morbid obesity  Renal/GU negative Renal ROS     Musculoskeletal  (+) Arthritis -, Osteoarthritis,    Abdominal (+) + obese,   Peds  Hematology negative hematology ROS (+)   Anesthesia Other Findings   Reproductive/Obstetrics                          Anesthesia Physical Anesthesia Plan  ASA: III  Anesthesia Plan: Spinal   Post-op Pain Management: MAC Combined w/ Regional for Post-op pain   Induction:   Airway Management Planned: Natural Airway and Simple Face Mask  Additional Equipment:   Intra-op Plan:   Post-operative Plan:   Informed Consent: I have reviewed the patients History and Physical, chart, labs and discussed the procedure including the risks, benefits and alternatives for the proposed anesthesia with the patient or authorized representative who has indicated his/her understanding and acceptance.   Dental advisory given  Plan Discussed with: Surgeon and CRNA  Anesthesia Plan Comments: (Plan routine monitors, SAB with adductor canal block for post op analgesia)        Anesthesia Quick Evaluation

## 2015-01-19 NOTE — Progress Notes (Signed)
Patient states she finished taking her antibiotic for UTI

## 2015-01-19 NOTE — Transfer of Care (Signed)
Immediate Anesthesia Transfer of Care Note  Patient: Jamie Espinoza  Procedure(s) Performed: Procedure(s): LEFT TOTAL KNEE ARTHROPLASTY (Left)  Patient Location: PACU  Anesthesia Type:Regional and Spinal  Level of Consciousness:  awake, patient cooperative and responds to stimulation  Airway & Oxygen Therapy:Patient Spontanous Breathing and Patient connected to face mask oxgen  Post-op Assessment:  Report given to PACU RN and Post -op Vital signs reviewed and stable  Post vital signs:  Reviewed and stable  Last Vitals:  Filed Vitals:   01/19/15 1510  BP: 136/103  Pulse:   Temp:   Resp:     Complications: No apparent anesthesia complications

## 2015-01-20 ENCOUNTER — Encounter (HOSPITAL_COMMUNITY): Payer: Self-pay | Admitting: Specialist

## 2015-01-20 LAB — BASIC METABOLIC PANEL
ANION GAP: 7 (ref 5–15)
BUN: 11 mg/dL (ref 6–20)
CALCIUM: 8.4 mg/dL — AB (ref 8.9–10.3)
CO2: 27 mmol/L (ref 22–32)
Chloride: 102 mmol/L (ref 101–111)
Creatinine, Ser: 0.68 mg/dL (ref 0.44–1.00)
Glucose, Bld: 183 mg/dL — ABNORMAL HIGH (ref 65–99)
POTASSIUM: 3.9 mmol/L (ref 3.5–5.1)
SODIUM: 136 mmol/L (ref 135–145)

## 2015-01-20 LAB — CBC
HCT: 32.6 % — ABNORMAL LOW (ref 36.0–46.0)
Hemoglobin: 10.7 g/dL — ABNORMAL LOW (ref 12.0–15.0)
MCH: 28.9 pg (ref 26.0–34.0)
MCHC: 32.8 g/dL (ref 30.0–36.0)
MCV: 88.1 fL (ref 78.0–100.0)
Platelets: 152 10*3/uL (ref 150–400)
RBC: 3.7 MIL/uL — AB (ref 3.87–5.11)
RDW: 13.4 % (ref 11.5–15.5)
WBC: 11.7 10*3/uL — AB (ref 4.0–10.5)

## 2015-01-20 LAB — ABO/RH: ABO/RH(D): O POS

## 2015-01-20 MED ORDER — METHOCARBAMOL 500 MG PO TABS: 500.0000 mg | ORAL_TABLET | Freq: Four times a day (QID) | ORAL | Status: DC | PRN

## 2015-01-20 MED ORDER — ASPIRIN EC 325 MG PO TBEC: 325.0000 mg | DELAYED_RELEASE_TABLET | Freq: Two times a day (BID) | ORAL | Status: AC

## 2015-01-20 MED ORDER — HYDROCODONE-ACETAMINOPHEN 7.5-325 MG PO TABS: 1.0000 | ORAL_TABLET | ORAL | Status: DC | PRN

## 2015-01-20 NOTE — Progress Notes (Signed)
OT Cancellation Note  Patient Details Name: Jamie Espinoza MRN: [id number] DOB: [birth year]   Cancelled Treatment:    Reason Eval/Treat Not Completed: Other (comment)  Noted plans for SNF. Will defer OT eval to SNF [facility] [place] Payton Mccallum D 01/20/2015, 10:06 AM

## 2015-01-20 NOTE — Progress Notes (Signed)
Date:  January 20, 2015 U.R. performed for needs and level of care. Will continue to follow for Case Management needs.  Velva Harman, RN, BSN, [place]   [phone]

## 2015-01-20 NOTE — Discharge Summary (Signed)
Physician Discharge Summary  Patient ID: Jamie Espinoza MRN: [id number] DOB/AGE: Nov 18, 1950 64 y.o.  Admit date: 01/19/2015 Discharge date:  01/21/2015 tentatively   Admission Diagnoses:  Discharge Diagnoses:  Active Problems:   Osteoarthritis of left knee   S/P knee replacement   Discharged Condition: good Hospital Course: Jamie Espinoza is a 64 y.o. who was admitted to [facility]. They were brought to the operating room on 01/19/2015 and underwent Procedure(s): LEFT TOTAL KNEE ARTHROPLASTY.  Patient tolerated the procedure well and was later transferred to the recovery room and then to the orthopaedic floor for postoperative care.  They were given PO and IV analgesics for pain control following their surgery.  They were given 24 hours of postoperative antibiotics of  Anti-infectives    Start     Dose/Rate Route Frequency Ordered Stop   01/20/15 0600  ceFAZolin (ANCEF) IVPB 2 g/50 mL premix     2 g 100 mL/hr over 30 Minutes Intravenous On call to O.R. 01/19/15 1224 01/19/15 1530   01/19/15 2200  ceFAZolin (ANCEF) IVPB 2 g/50 mL premix     2 g 100 mL/hr over 30 Minutes Intravenous Every 6 hours 01/19/15 1847 01/20/15 0511     and started on DVT prophylaxis in the form of lovenox.   PT and OT were ordered for total joint protocol.  Discharge planning consulted to help with postop disposition and equipment needs.  Patient had a good night on the evening of surgery and started to get up OOB with therapy on day one.  Hemovac drain was pulled without difficulty.  Continued to work with therapy.  Dressing was with normal limits.  The patient had progressed with therapy and meeting their goals. Patient was seen in rounds and was ready to go home.  Consults: n/a  Significant Diagnostic Studies: routine  Treatments:routine  Discharge Exam: Blood pressure 104/55, pulse 71, temperature 97.6 F (36.4 C), temperature source Oral, resp. rate 16, height 5\' 3"  (1.6 m), weight 97.523 kg (215  lb), SpO2 98 %. Alert and oriented x3. RRR, Lungs clear, BS x4. Left Calf soft and non tender. L knee dressing C/D/I. No DVT signs. No signs of infection or compartment syndrome. LLE grossly neurovascularly intact.   Disposition: 06-Home-Health Care Svc  Discharge Instructions    Call MD / Call 911    Complete by:  As directed   If you experience chest pain or shortness of breath, CALL 911 and be transported to the hospital emergency room.  If you develope a fever above 101 F, pus (white drainage) or increased drainage or redness at the wound, or calf pain, call your surgeon's office.     Constipation Prevention    Complete by:  As directed   Drink plenty of fluids.  Prune juice may be helpful.  You may use a stool softener, such as Colace (over the counter) 100 mg twice a day.  Use MiraLax (over the counter) for constipation as needed.     Diet - low sodium heart healthy    Complete by:  As directed      Discharge instructions    Complete by:  As directed   INSTRUCTIONS AFTER JOINT REPLACEMENT   Remove items at home which could result in a fall. This includes throw rugs or furniture in walking pathways ICE to the affected joint every three hours while awake for 30 minutes at a time, for at least the first 3-5 days, and then as needed for pain and  swelling.  Continue to use ice for pain and swelling. You may notice swelling that will progress down to the foot and ankle.  This is normal after surgery.  Elevate your leg when you are not up walking on it.   Continue to use the breathing machine you got in the hospital (incentive spirometer) which will help keep your temperature down.  It is common for your temperature to cycle up and down following surgery, especially at night when you are not up moving around and exerting yourself.  The breathing machine keeps your lungs expanded and your temperature down.   DIET:  As you were doing prior to hospitalization, we recommend a well-balanced  diet.  DRESSING / WOUND CARE / SHOWERING   Leave dressing in place, may remove the drain site dressing on the side. Place a band aid and neosporin over the site. May shower.  ACTIVITY  Increase activity slowly as tolerated, but follow the weight bearing instructions below.   No driving for 6 weeks or until further direction given by your physician.  You cannot drive while taking narcotics.  No lifting or carrying greater than 10 lbs. until further directed by your surgeon. Avoid periods of inactivity such as sitting longer than an hour when not asleep. This helps prevent blood clots.  You may return to work once you are authorized by your doctor.     WEIGHT BEARING   As tolerated, unless directed otherwise. Use assistive devices for safety.     EXERCISES  Results after joint replacement surgery are often greatly improved when you follow the exercise, range of motion and muscle strengthening exercises prescribed by your doctor. Safety measures are also important to protect the joint from further injury. Any time any of these exercises cause you to have increased pain or swelling, decrease what you are doing until you are comfortable again and then slowly increase them. If you have problems or questions, call your caregiver or physical therapist for advice.   Rehabilitation is important following a joint replacement. After just a few days of immobilization, the muscles of the leg can become weakened and shrink (atrophy).  These exercises are designed to build up the tone and strength of the thigh and leg muscles and to improve motion. Often times heat used for twenty to thirty minutes before working out will loosen up your tissues and help with improving the range of motion but do not use heat for the first two weeks following surgery (sometimes heat can increase post-operative swelling).   These exercises can be done on a training (exercise) mat, on the floor, on a table or on a bed. Use  whatever works the best and is most comfortable for you.    Use music or television while you are exercising so that the exercises are a pleasant break in your day. This will make your life better with the exercises acting as a break in your routine that you can look forward to.   Perform all exercises about fifteen times, three times per day or as directed.  You should exercise both the operative leg and the other leg as well.    Exercises include:   Quad Sets - Tighten up the muscle on the front of the thigh (Quad) and hold for 5-10 seconds.   Straight Leg Raises - With your knee straight (if you were given a brace, keep it on), lift the leg to 60 degrees, hold for 3 seconds, and slowly lower the leg.  Perform this exercise against resistance later as your leg gets stronger.  Leg Slides: Lying on your back, slowly slide your foot toward your buttocks, bending your knee up off the floor (only go as far as is comfortable). Then slowly slide your foot back down until your leg is flat on the floor again.  Angel Wings: Lying on your back spread your legs to the side as far apart as you can without causing discomfort.  Hamstring Strength:  Lying on your back, push your heel against the floor with your leg straight by tightening up the muscles of your buttocks.  Repeat, but this time bend your knee to a comfortable angle, and push your heel against the floor.  You may put a pillow under the heel to make it more comfortable if necessary.   A rehabilitation program following joint replacement surgery can speed recovery and prevent re-injury in the future due to weakened muscles. Contact your doctor or a physical therapist for more information on knee rehabilitation.    CONSTIPATION  Constipation is defined medically as fewer than three stools per week and severe constipation as less than one stool per week.  Even if you have a regular bowel pattern at home, your normal regimen is likely to be disrupted due  to multiple reasons following surgery.  Combination of anesthesia, postoperative narcotics, change in appetite and fluid intake all can affect your bowels.   YOU MUST use at least one of the following options; they are listed in order of increasing strength to get the job done.  They are all available over the counter, and you may need to use some, POSSIBLY even all of these options:    Drink plenty of fluids (prune juice may be helpful) and high fiber foods Colace 100 mg by mouth twice a day  Senokot for constipation as directed and as needed Dulcolax (bisacodyl), take with full glass of water  Miralax (polyethylene glycol) once or twice a day as needed.  If you have tried all these things and are unable to have a bowel movement in the first 3-4 days after surgery call either your surgeon or your primary doctor.    If you experience loose stools or diarrhea, hold the medications until you stool forms back up.  If your symptoms do not get better within 1 week or if they get worse, check with your doctor.  If you experience "the worst abdominal pain ever" or develop nausea or vomiting, please contact the office immediately for further recommendations for treatment.   ITCHING:  If you experience itching with your medications, try taking only a single pain pill, or even half a pain pill at a time.  You can also use Benadryl over the counter for itching or also to help with sleep.   TED HOSE STOCKINGS:  Use stockings on both legs until for at least 2 weeks or as directed by physician office. They may be removed at night for sleeping.  MEDICATIONS:  See your medication summary on the "After Visit Summary" that nursing will review with you.  You may have some home medications which will be placed on hold until you complete the course of blood thinner medication.  It is important for you to complete the blood thinner medication as prescribed.  PRECAUTIONS:  If you experience chest pain or shortness of  breath - call 911 immediately for transfer to the hospital emergency department.   If you develop a fever greater that 101 F, purulent drainage  from wound, increased redness or drainage from wound, foul odor from the wound/dressing, or calf pain - CONTACT YOUR SURGEON.                                                   FOLLOW-UP APPOINTMENTS:  If you do not already have a post-op appointment, please call the office [phone] for an appointment to be seen by your surgeon.  Guidelines for how soon to be seen are listed in your "After Visit Summary", but are typically between 1-4 weeks after surgery.  OTHER INSTRUCTIONS:   Knee Replacement:  Do not place pillow under knee, focus on keeping the knee straight while resting. CPM instructions: 0-90 degrees, 2 hours in the morning, 2 hours in the afternoon, and 2 hours in the evening. Place foam block, curve side up under heel at all times except when in CPM or when walking.  DO NOT modify, tear, cut, or change the foam block in any way.  MAKE SURE YOU:  Understand these instructions.  Get help right away if you are not doing well or get worse.    Thank you for letting us be a part of your medical care team.  It is a privilege we respect greatly.  We hope these instructions will help you stay on track for a fast and full recovery!     Do not put a pillow under the knee. Place it under the heel.    Complete by:  As directed      Increase activity slowly as tolerated    Complete by:  As directed      TED hose    Complete by:  As directed   Use stockings (TED hose) for 2 weeks on both leg(s).  You may remove them at night for sleeping.            Medication List    TAKE these medications        aspirin EC 325 MG tablet  Take 1 tablet (325 mg total) by mouth 2 (two) times daily.     B-complex with vitamin C tablet  Take 1 tablet by mouth at bedtime.     buPROPion 300 MG 24 hr tablet  Commonly known as:  WELLBUTRIN XL  Take 300 mg by mouth  daily.     Calcium-Magnesium-Zinc 333.33-133.33-5 MG Tabs  Take 1 tablet by mouth 2 (two) times daily.     celecoxib 200 MG capsule  Commonly known as:  CELEBREX  Take 200 mg by mouth daily.     cholecalciferol 1000 UNITS tablet  Commonly known as:  VITAMIN D  Take 1,000 Units by mouth 2 (two) times daily.     HYDROcodone-acetaminophen 7.5-325 MG per tablet  Commonly known as:  NORCO  Take 1-2 tablets by mouth every 4 (four) hours as needed.     levothyroxine 75 MCG tablet  Commonly known as:  SYNTHROID, LEVOTHROID  Take 75 mcg by mouth at bedtime.     losartan-hydrochlorothiazide 50-12.5 MG per tablet  Commonly known as:  HYZAAR  Take 1 tablet by mouth at bedtime.     methocarbamol 500 MG tablet  Commonly known as:  ROBAXIN  Take 1 tablet (500 mg total) by mouth every 6 (six) hours as needed.     MUCINEX ALLERGY 180 MG tablet  Generic drug:  fexofenadine  Take 180 mg by mouth daily.     multivitamin with minerals Tabs tablet  Take 1 tablet by mouth at bedtime.     niacin 1000 MG CR tablet  Commonly known as:  NIASPAN  Take 1,000 mg by mouth at bedtime.     omega-3 acid ethyl esters 1 G capsule  Commonly known as:  LOVAZA  Take 2 g by mouth 2 (two) times daily.     omeprazole 20 MG capsule  Commonly known as:  PRILOSEC  Take 20 mg by mouth at bedtime.     simvastatin 20 MG tablet  Commonly known as:  ZOCOR  Take 20 mg by mouth daily.     tetrahydrozoline-zinc 0.05-0.25 % ophthalmic solution  Commonly known as:  VISINE-AC  Place 2 drops into both eyes 3 (three) times daily as needed (itching and redness of eyes).     vitamin C 1000 MG tablet  Take 1,000 mg by mouth daily.     vitamin E 1000 UNIT capsule  Take 1,000 Units by mouth daily.           Follow-up Information    Follow up with Cynda Familia, MD. Go in 2 weeks.   Specialty:  Orthopedic Surgery   Contact information:   [address] [place]  [address] [phone]       Signed: Lajean Manes 01/20/2015, 7:58 AM

## 2015-01-20 NOTE — Clinical Social Work Placement (Signed)
   CLINICAL SOCIAL WORK PLACEMENT  NOTE  Date:  01/20/2015  Patient Details  Name: Jamie Espinoza MRN: [id number] Date of Birth: 08/21/1950  Clinical Social Work is seeking post-discharge placement for this patient at the [place] level of care (*CSW will initial, date and re-position this form in  chart as items are completed):  No   Patient/family provided with [place] Work Department's list of facilities offering this level of care within the geographic area requested by the patient (or if unable, by the patient's family).  Yes   Patient/family informed of their freedom to choose among providers that offer the needed level of care, that participate in Medicare, Medicaid or managed care program needed by the patient, have an available bed and are willing to accept the patient.  No   Patient/family informed of [place]'s ownership interest in [facility] and [facility], as well as of the fact that they are under no obligation to receive care at these facilities.  PASRR submitted to EDS on 01/19/15     PASRR number received on 01/19/15     Existing PASRR number confirmed on       FL2 transmitted to all facilities in geographic area requested by pt/family on 01/20/15     FL2 transmitted to all facilities within larger geographic area on       Patient informed that his/her managed care company has contracts with or will negotiate with certain facilities, including the following:        Yes   Patient/family informed of bed offers received.  Patient chooses bed at [facility]     Physician recommends and patient chooses bed at      Patient to be transferred to   on  .  Patient to be transferred to facility by       Patient family notified on   of transfer.  Name of family member notified:        PHYSICIAN       Additional Comment:    _______________________________________________ Luretha Rued, LCSW 01/20/2015, 9:41  AM

## 2015-01-20 NOTE — Care Management Note (Signed)
Case Management Note  Patient Details  Name: Jamie Espinoza MRN: [id number] Date of Birth: [birth year]  Subjective/Objective:                 tkr   Action/Plan: snf   Expected Discharge Date:  01/21/15               Expected Discharge Plan:  Skilled Nursing Facility  In-House Referral:  Clinical Social Work  Discharge planning Services  CM Consult  Post Acute Care Choice:    Choice offered to:     DME Arranged:    DME Agency:     HH Arranged:    [place] Agency:     Status of Service:  In process, will continue to follow  Medicare Important Message Given:    Date Medicare IM Given:    Medicare IM give by:    Date Additional Medicare IM Given:    Additional Medicare Important Message give by:     If discussed at [place] of Stay Meetings, dates discussed:    Additional Comments:  Leeroy Cha, RN 01/20/2015, 10:24 AM

## 2015-01-20 NOTE — Clinical Social Work Note (Signed)
Clinical Social Work Assessment  Patient Details  Name: Jamie Espinoza MRN: [id number] Date of Birth: 1950-11-16  Date of referral:  01/20/15               Reason for consult:  Facility Placement, Discharge Planning                Permission sought to share information with:    Permission granted to share information::     Name::        Agency::     Relationship::     Contact Information:     Housing/Transportation Living arrangements for the past 2 months:  Single Family Home Source of Information:  Patient Patient Interpreter Needed:  None Criminal Activity/Legal Involvement Pertinent to Current Situation/Hospitalization:  No - Comment as needed Significant Relationships:  Adult Children, Friend Lives with:  Self Do you feel safe going back to the place where you live?   (ST Rehab is needed.) Need for family participation in patient care:  No (Coment)  Care giving concerns:  Pt feels her care cannot be managed at home following hospital d/c.   Social Worker assessment / plan:  Pt hospitalized on 01/19/15 for pre planned left total knee arthroplasty. CSW spoke with PA and met with pt to assist with d/c planning. Pt has made prior arrangements to have ST Rehab at [facility] following hospital d/c. CSW has contacted SNF and d/c plan has been confirmed. Expecting d/c SAT. Weekend CSW will assist with d/c planning to SNF.  Employment status:  [employer] information:  Managed Care PT Recommendations:  Not assessed at this time Information / Referral to community resources:  [place]  Patient/Family's Response to care: Pt feels ST Rehab is needed at d/c.   Patient/Family's Understanding of and Emotional Response to Diagnosis, Current Treatment, and Prognosis:  Pt had a good night and is motivated to work with therapy this am. She is looking forward to rehab at [facility].  Emotional Assessment Appearance:  Appears stated age Attitude/Demeanor/Rapport:     Affect (typically observed):  Other (cooperative) Orientation:  Oriented to Self, Oriented to Place, Oriented to  Time, Oriented to Situation Alcohol / Substance use:  Not Applicable Psych involvement (Current and /or in the community):  No (Comment)  Discharge Needs  Concerns to be addressed:  Discharge Planning Concerns Readmission within the last 30 days:  No Current discharge risk:  None Barriers to Discharge:  No Barriers Identified   Mady Oubre, Randall An, LCSW 01/20/2015, 9:31 AM

## 2015-01-20 NOTE — Evaluation (Signed)
Physical Therapy Evaluation Patient Details Name: Jamie Espinoza MRN: [id number] DOB: October 26, 1950 Today's Date: 01/20/2015   History of Present Illness  L TKR  Clinical Impression  Pt s/p L TKR presents with decreased L LE strength/ROM and post op pain limiting functional mobility.  Pt would benefit from follow up rehab at SNF level to maximize IND and safety prior to return home alone.    Follow Up Recommendations SNF    Equipment Recommendations  None recommended by PT    Recommendations for Other Services OT consult     Precautions / Restrictions Precautions Precautions: Knee;Fall Required Braces or Orthoses: Knee Immobilizer - Left (Pt performed IND SLR this am) Knee Immobilizer - Left: Discontinue once straight leg raise with < 10 degree lag Restrictions Weight Bearing Restrictions: No Other Position/Activity Restrictions: WBAT      Mobility  Bed Mobility Overal bed mobility: Needs Assistance Bed Mobility: Supine to Sit     Supine to sit: Min assist     General bed mobility comments: cues for sequence and use of UEs to self assist  Transfers Overall transfer level: Needs assistance Equipment used: Rolling walker (2 wheeled) Transfers: Sit to/from Stand Sit to Stand: Min assist         General transfer comment: cues for LE management and use of UEs to self assist  Ambulation/Gait Ambulation/Gait assistance: Min assist Ambulation Distance (Feet): 38 Feet (twice) Assistive device: Rolling walker (2 wheeled) Gait Pattern/deviations: Step-to pattern;Step-through pattern;Decreased step length - right;Decreased step length - left;Shuffle;Trunk flexed     General Gait Details: cues for sequence, posture and position from [employer]            Wheelchair Mobility    Modified Rankin (Stroke Patients Only)       Balance                                             Pertinent Vitals/Pain Pain Assessment: 0-10 Pain Score: 5  Pain  Location: R knee Pain Descriptors / Indicators: Aching;Sore Pain Intervention(s): Limited activity within patient's tolerance;Monitored during session;Ice applied;Premedicated before session    Home Living Family/patient expects to be discharged to:: Skilled nursing facility Living Arrangements: Alone                    Prior Function Level of Independence: Independent               Hand Dominance   Dominant Hand: Right    Extremity/Trunk Assessment   Upper Extremity Assessment: Overall WFL for tasks assessed           Lower Extremity Assessment: LLE deficits/detail   LLE Deficits / Details: 3/5 quads with AAROM at knee -5 - 65  Cervical / Trunk Assessment: Normal  Communication   Communication: No difficulties  Cognition Arousal/Alertness: Awake/alert Behavior During Therapy: WFL for tasks assessed/performed Overall Cognitive Status: Within Functional Limits for tasks assessed                      General Comments      Exercises Total Joint Exercises Ankle Circles/Pumps: AROM;Both;15 reps;Supine Quad Sets: AROM;Both;10 reps;Supine Heel Slides: AAROM;Left;15 reps;Supine Straight Leg Raises: AAROM;AROM;Left;15 reps;Supine      Assessment/Plan    PT Assessment Patient needs continued PT services  PT Diagnosis Difficulty walking   PT Problem List Decreased strength;Decreased  range of motion;Decreased activity tolerance;Decreased mobility;Decreased knowledge of use of DME;Pain  PT Treatment Interventions DME instruction;Gait training;Functional mobility training;Therapeutic activities;Therapeutic exercise;Patient/family education   PT Goals (Current goals can be found in the Care Plan section) Acute Rehab PT Goals Patient Stated Goal: Rehab and then home alone PT Goal Formulation: With patient Time For Goal Achievement: 01/26/15 Potential to Achieve Goals: Good    Frequency 7X/week   Barriers to discharge        Co-evaluation                End of Session Equipment Utilized During Treatment: Gait belt Activity Tolerance: Patient tolerated treatment well Patient left: in chair;with call bell/phone within reach;with family/visitor present Nurse Communication: Mobility status         Time: 8115-7262 PT Time Calculation (min) (ACUTE ONLY): 40 min   Charges:   PT Evaluation $Initial PT Evaluation Tier I: 1 Procedure PT Treatments $Gait Training: 8-22 mins $Therapeutic Exercise: 8-22 mins   PT G Codes:        Glennda Weatherholtz 02-18-15, 2:49 PM

## 2015-01-20 NOTE — Progress Notes (Signed)
Physical Therapy Treatment Patient Details Name: TYMIRA HORKEY MRN: [id number] DOB: Jun 05, 1951 Today's Date: 01/20/2015    History of Present Illness L TKR    PT Comments    Pt very motivated and progressing well with mobility.  Follow Up Recommendations  SNF     Equipment Recommendations  None recommended by PT    Recommendations for Other Services OT consult     Precautions / Restrictions Precautions Precautions: Knee;Fall Required Braces or Orthoses: Knee Immobilizer - Left Knee Immobilizer - Left: Discontinue once straight leg raise with < 10 degree lag Restrictions Weight Bearing Restrictions: No Other Position/Activity Restrictions: WBAT    Mobility  Bed Mobility Overal bed mobility: Needs Assistance Bed Mobility: Supine to Sit;Sit to Supine     Supine to sit: Min assist Sit to supine: Min assist   General bed mobility comments: cues for sequence and use of UEs to self assist  Transfers Overall transfer level: Needs assistance Equipment used: Rolling walker (2 wheeled) Transfers: Sit to/from Stand Sit to Stand: Min assist         General transfer comment: cues for LE management and use of UEs to self assist  Ambulation/Gait Ambulation/Gait assistance: Min assist Ambulation Distance (Feet): 66 Feet (twice) Assistive device: Rolling walker (2 wheeled) Gait Pattern/deviations: Decreased step length - right;Decreased step length - left;Step-to pattern;Step-through pattern;Shuffle;Trunk flexed     General Gait Details: cues for sequence, posture and position from [employer]            Wheelchair Mobility    Modified Rankin (Stroke Patients Only)       Balance                                    Cognition Arousal/Alertness: Awake/alert Behavior During Therapy: WFL for tasks assessed/performed Overall Cognitive Status: Within Functional Limits for tasks assessed                      Exercises Total Joint  Exercises Ankle Circles/Pumps: AROM;Both;15 reps;Supine Quad Sets: AROM;Both;10 reps;Supine Heel Slides: AAROM;Left;15 reps;Supine Straight Leg Raises: AAROM;AROM;Left;15 reps;Supine    General Comments        Pertinent Vitals/Pain Pain Assessment: 0-10 Pain Score: 6  Pain Location: R knee  Pain Descriptors / Indicators: Aching;Sore Pain Intervention(s): Limited activity within patient's tolerance;Monitored during session;Premedicated before session;Ice applied    Home Living                      Prior Function            PT Goals (current goals can now be found in the care plan section) Acute Rehab PT Goals Patient Stated Goal: Rehab and then home alone PT Goal Formulation: With patient Time For Goal Achievement: 01/26/15 Potential to Achieve Goals: Good Progress towards PT goals: Progressing toward goals    Frequency  7X/week    PT Plan Current plan remains appropriate    Co-evaluation             End of Session Equipment Utilized During Treatment: Gait belt Activity Tolerance: Patient tolerated treatment well Patient left: in bed;with call bell/phone within reach     Time: 1829-9371 PT Time Calculation (min) (ACUTE ONLY): 24 min  Charges:  $Gait Training: 8-22 mins $Therapeutic Exercise: 8-22 mins  G Codes:      Tatia Petrucci 2015-01-23, 5:03 PM

## 2015-01-20 NOTE — Progress Notes (Signed)
Subjective: 1 Day Post-Op Procedure(s) (LRB): LEFT TOTAL KNEE ARTHROPLASTY (Left) Patient reports pain as mild to left knee.  Reports a good night. Tolerating PO's. No f/c. Denies SOB, CP, or calf pain.  Objective: Vital signs in last 24 hours: Temp:  [97.6 F (36.4 C)-98.1 F (36.7 C)] 97.6 F (36.4 C) (06/17 0623) Pulse Rate:  [70-97] 71 (06/17 0623) Resp:  [9-23] 16 (06/17 0623) BP: (104-182)/(55-103) 104/55 mmHg (06/17 0623) SpO2:  [91 %-100 %] 98 % (06/17 0623) Weight:  [97.523 kg (215 lb)] 97.523 kg (215 lb) (06/16 1252)  Intake/Output from previous day: 06/16 0701 - 06/17 0700 In: 2200 [I.V.:2200] Out: 3500 [Urine:3330; Drains:70; Blood:100] Intake/Output this shift:     Recent Labs  01/20/15 0443  HGB 10.7*    Recent Labs  01/20/15 0443  WBC 11.7*  RBC 3.70*  HCT 32.6*  PLT 152    Recent Labs  01/20/15 0443  NA 136  K 3.9  CL 102  CO2 27  BUN 11  CREATININE 0.68  GLUCOSE 183*  CALCIUM 8.4*   No results for input(s): LABPT, INR in the last 72 hours.  Alert and oriented x3. RRR, Lungs clear, BS x4. Left Calf soft and non tender. L knee dressing C/D/I. No DVT signs. No signs of infection or compartment syndrome. LLE grossly neurovascularly intact.  Drain d/c'ed. Pt tolerated well. Tip was intact. Hemostasis obtained at drain site.  Assessment/Plan: 1 Day Post-Op Procedure(s) (LRB): LEFT TOTAL KNEE ARTHROPLASTY (Left) Plan to D/c to [facility] place tomorrow or today if doing well Up with PT Drain D/c'ed Continue current care  Cloy Cozzens L 01/20/2015, 7:46 AM

## 2015-01-21 LAB — CBC
HCT: 31.5 % — ABNORMAL LOW (ref 36.0–46.0)
Hemoglobin: 10.4 g/dL — ABNORMAL LOW (ref 12.0–15.0)
MCH: 29.5 pg (ref 26.0–34.0)
MCHC: 33 g/dL (ref 30.0–36.0)
MCV: 89.5 fL (ref 78.0–100.0)
PLATELETS: 177 10*3/uL (ref 150–400)
RBC: 3.52 MIL/uL — ABNORMAL LOW (ref 3.87–5.11)
RDW: 13.8 % (ref 11.5–15.5)
WBC: 10.2 10*3/uL (ref 4.0–10.5)

## 2015-01-21 NOTE — Progress Notes (Signed)
Pt discharge via wheelchair for transport, friend transport to facility.  Gave report to [facility].  No unchanged assessments, no distress.  Gave discharge instructions.  Pt verbalized understanding. Lavell Anchors RN

## 2015-01-21 NOTE — Clinical Social Work Placement (Signed)
   CLINICAL SOCIAL WORK PLACEMENT  NOTE  Date:  01/21/2015  Patient Details  Name: Jamie Espinoza MRN: [id number] Date of Birth: 04/19/1951  Clinical Social Work is seeking post-discharge placement for this patient at the [place] level of care (*CSW will initial, date and re-position this form in  chart as items are completed):  No   Patient/family provided with [facility] Work Department's list of facilities offering this level of care within the geographic area requested by the patient (or if unable, by the patient's family).  Yes   Patient/family informed of their freedom to choose among providers that offer the needed level of care, that participate in Medicare, Medicaid or managed care program needed by the patient, have an available bed and are willing to accept the patient.  No   Patient/family informed of [facility]'s ownership interest in [facility] and [facility], as well as of the fact that they are under no obligation to receive care at these facilities.  PASRR submitted to EDS on 01/19/15     PASRR number received on 01/19/15     Existing PASRR number confirmed on       FL2 transmitted to all facilities in geographic area requested by pt/family on 01/20/15     FL2 transmitted to all facilities within larger geographic area on       Patient informed that his/her managed care company has contracts with or will negotiate with certain facilities, including the following:        Yes   Patient/family informed of bed offers received.  Patient chooses bed at [facility]     Physician recommends and patient chooses bed at   [facility]   Patient to be transferred to  [facility] on  January 21, 2015.  Patient to be transferred to facility by  friend     Patient family notified on   June 18, 16 of transfer.  Name of family member notified:      Brenda/friend  PHYSICIAN       Additional Comment:     _______________________________________________ Carlean Jews, LCSW 01/21/2015, 2:58 PM

## 2015-01-21 NOTE — Progress Notes (Signed)
   Subjective: 2 Days Post-Op Procedure(s) (LRB): LEFT TOTAL KNEE ARTHROPLASTY (Left)  Pt states she is having a rough day today Plan for rehab admission today Mild to moderate pain and c/o limited quad activation Otherwise doing fairly well Patient reports pain as moderate.  Objective:   VITALS:   Filed Vitals:   01/21/15 0518  BP: 145/82  Pulse: 88  Temp: 98 F (36.7 C)  Resp: 16    Left knee incision healing well Dressing in place nv intact distally No rashes or edema  LABS  Recent Labs  01/20/15 0443 01/21/15 0505  HGB 10.7* 10.4*  HCT 32.6* 31.5*  WBC 11.7* 10.2  PLT 152 177     Recent Labs  01/20/15 0443  NA 136  K 3.9  BUN 11  CREATININE 0.68  GLUCOSE 183*     Assessment/Plan: 2 Days Post-Op Procedure(s) (LRB): LEFT TOTAL KNEE ARTHROPLASTY (Left) Pt doing fairly well Plan for d/c to [facility] place today F/u in 2 weeks PT/OT    Merla Riches, MPAS, PA-C  01/21/2015, 7:59 AM

## 2015-01-21 NOTE — Progress Notes (Signed)
Physical Therapy Treatment Patient Details Name: Jamie Espinoza MRN: [id number] DOB: 1950/11/24 Today's Date: 01/21/2015    History of Present Illness L TKR    PT Comments      Follow Up Recommendations  SNF     Equipment Recommendations  None recommended by PT    Recommendations for Other Services OT consult     Precautions / Restrictions Precautions Precautions: Knee;Fall Required Braces or Orthoses: Knee Immobilizer - Left Knee Immobilizer - Left: Discontinue once straight leg raise with < 10 degree lag Restrictions Weight Bearing Restrictions: No Other Position/Activity Restrictions: WBAT    Mobility  Bed Mobility Overal bed mobility: Needs Assistance Bed Mobility: Supine to Sit;Sit to Supine     Supine to sit: Min assist Sit to supine: Min assist   General bed mobility comments: cues for sequence and use of UEs to self assist  Transfers Overall transfer level: Needs assistance Equipment used: Rolling walker (2 wheeled) Transfers: Sit to/from Stand Sit to Stand: Min assist         General transfer comment: cues for LE management and use of UEs to self assist  Ambulation/Gait Ambulation/Gait assistance: Min assist;Min guard Ambulation Distance (Feet): 70 Feet (twice) Assistive device: Rolling walker (2 wheeled) Gait Pattern/deviations: Step-to pattern;Step-through pattern;Decreased step length - right;Decreased step length - left;Shuffle;Trunk flexed     General Gait Details: cues for sequence, posture and position from [employer]            Wheelchair Mobility    Modified Rankin (Stroke Patients Only)       Balance                                    Cognition Arousal/Alertness: Awake/alert Behavior During Therapy: WFL for tasks assessed/performed Overall Cognitive Status: Within Functional Limits for tasks assessed                      Exercises Total Joint Exercises Ankle Circles/Pumps: AROM;Both;15  reps;Supine Quad Sets: AROM;Both;10 reps;Supine Heel Slides: AAROM;Left;15 reps;Supine Straight Leg Raises: AAROM;Left;15 reps;Supine    General Comments        Pertinent Vitals/Pain Pain Assessment: 0-10 Pain Score: 6  Pain Location: R knee Pain Descriptors / Indicators: Aching;Sore Pain Intervention(s): Limited activity within patient's tolerance;Monitored during session;Premedicated before session;Ice applied    Home Living                      Prior Function            PT Goals (current goals can now be found in the care plan section) Acute Rehab PT Goals Patient Stated Goal: Rehab and then home alone PT Goal Formulation: With patient Time For Goal Achievement: 01/26/15 Potential to Achieve Goals: Good Progress towards PT goals: Progressing toward goals    Frequency  7X/week    PT Plan Current plan remains appropriate    Co-evaluation             End of Session Equipment Utilized During Treatment: Gait belt;Left knee immobilizer Activity Tolerance: Patient tolerated treatment well Patient left: in bed;with call bell/phone within reach     Time: 1031-1058 PT Time Calculation (min) (ACUTE ONLY): 27 min  Charges:  $Gait Training: 8-22 mins $Therapeutic Exercise: 8-22 mins                    G Codes:  Lionell Matuszak 01/21/2015, 1:43 PM

## 2015-01-21 NOTE — Plan of Care (Signed)
Problem: Consults Goal: Diagnosis- Total Joint Replacement Outcome: Completed/Met Date Met:  01/21/15 Primary Total Knee LEFT  Problem: Phase II Progression Outcomes Goal: Discharge plan established Outcome: Completed/Met Date Met:  01/21/15 [place]  Problem: Phase III Progression Outcomes Goal: Anticoagulant follow-up in place Outcome: Not Applicable Date Met:  88/50/27 Pt will be on ASA for VTE after d/c.

## 2015-01-21 NOTE — Discharge Summary (Signed)
Physician Discharge Summary   Patient ID: Jamie Espinoza MRN: [id number] DOB/AGE: 1951/07/17 64 y.o.  Admit date: 01/19/2015 Discharge date: 01/21/2015  Admission Diagnoses:  Active Problems:   Osteoarthritis of left knee   S/P knee replacement   Discharge Diagnoses:  Same   Surgeries: Procedure(s): LEFT TOTAL KNEE ARTHROPLASTY on 01/19/2015   Consultants: PT/OT  Discharged Condition: Stable  Hospital Course: Jamie Espinoza is an 64 y.o. female who was admitted 01/19/2015 with a chief complaint of No chief complaint on file. , and found to have a diagnosis of <principal problem not specified>.  They were brought to the operating room on 01/19/2015 and underwent the above named procedures.    The patient had an uncomplicated hospital course and was stable for discharge.  Recent vital signs:  Filed Vitals:   01/21/15 0518  BP: 145/82  Pulse: 88  Temp: 98 F (36.7 C)  Resp: 16    Recent laboratory studies:  Results for orders placed or performed during the hospital encounter of 01/19/15  CBC  Result Value Ref Range   WBC 11.7 (H) 4.0 - 10.5 K/uL   RBC 3.70 (L) 3.87 - 5.11 MIL/uL   Hemoglobin 10.7 (L) 12.0 - 15.0 g/dL   HCT 32.6 (L) 36.0 - 46.0 %   MCV 88.1 78.0 - 100.0 fL   MCH 28.9 26.0 - 34.0 pg   MCHC 32.8 30.0 - 36.0 g/dL   RDW 13.4 11.5 - 15.5 %   Platelets 152 150 - 400 K/uL  Basic metabolic panel  Result Value Ref Range   Sodium 136 135 - 145 mmol/L   Potassium 3.9 3.5 - 5.1 mmol/L   Chloride 102 101 - 111 mmol/L   CO2 27 22 - 32 mmol/L   Glucose, Bld 183 (H) 65 - 99 mg/dL   BUN 11 6 - 20 mg/dL   Creatinine, Ser 0.68 0.44 - 1.00 mg/dL   Calcium 8.4 (L) 8.9 - 10.3 mg/dL   GFR calc non Af Amer >60 >60 mL/min   GFR calc Af Amer >60 >60 mL/min   Anion gap 7 5 - 15  CBC  Result Value Ref Range   WBC 10.2 4.0 - 10.5 K/uL   RBC 3.52 (L) 3.87 - 5.11 MIL/uL   Hemoglobin 10.4 (L) 12.0 - 15.0 g/dL   HCT 31.5 (L) 36.0 - 46.0 %   MCV 89.5 78.0 - 100.0 fL   MCH  29.5 26.0 - 34.0 pg   MCHC 33.0 30.0 - 36.0 g/dL   RDW 13.8 11.5 - 15.5 %   Platelets 177 150 - 400 K/uL  Type and screen  Result Value Ref Range   ABO/RH(D) O POS    Antibody Screen NEG    Sample Expiration 01/22/2015   ABO/Rh  Result Value Ref Range   ABO/RH(D) O POS     Discharge Medications:     Medication List    TAKE these medications        aspirin EC 325 MG tablet  Take 1 tablet (325 mg total) by mouth 2 (two) times daily.     B-complex with vitamin C tablet  Take 1 tablet by mouth at bedtime.     buPROPion 300 MG 24 hr tablet  Commonly known as:  WELLBUTRIN XL  Take 300 mg by mouth daily.     Calcium-Magnesium-Zinc 333.33-133.33-5 MG Tabs  Take 1 tablet by mouth 2 (two) times daily.     celecoxib 200 MG capsule  Commonly known as:  CELEBREX  Take 200 mg by mouth daily.     cholecalciferol 1000 UNITS tablet  Commonly known as:  VITAMIN D  Take 1,000 Units by mouth 2 (two) times daily.     HYDROcodone-acetaminophen 7.5-325 MG per tablet  Commonly known as:  NORCO  Take 1-2 tablets by mouth every 4 (four) hours as needed.     levothyroxine 75 MCG tablet  Commonly known as:  SYNTHROID, LEVOTHROID  Take 75 mcg by mouth at bedtime.     losartan-hydrochlorothiazide 50-12.5 MG per tablet  Commonly known as:  HYZAAR  Take 1 tablet by mouth at bedtime.     methocarbamol 500 MG tablet  Commonly known as:  ROBAXIN  Take 1 tablet (500 mg total) by mouth every 6 (six) hours as needed.     MUCINEX ALLERGY 180 MG tablet  Generic drug:  fexofenadine  Take 180 mg by mouth daily.     multivitamin with minerals Tabs tablet  Take 1 tablet by mouth at bedtime.     niacin 1000 MG CR tablet  Commonly known as:  NIASPAN  Take 1,000 mg by mouth at bedtime.     omega-3 acid ethyl esters 1 G capsule  Commonly known as:  LOVAZA  Take 2 g by mouth 2 (two) times daily.     omeprazole 20 MG capsule  Commonly known as:  PRILOSEC  Take 20 mg by mouth at bedtime.      simvastatin 20 MG tablet  Commonly known as:  ZOCOR  Take 20 mg by mouth daily.     tetrahydrozoline-zinc 0.05-0.25 % ophthalmic solution  Commonly known as:  VISINE-AC  Place 2 drops into both eyes 3 (three) times daily as needed (itching and redness of eyes).     vitamin C 1000 MG tablet  Take 1,000 mg by mouth daily.     vitamin E 1000 UNIT capsule  Take 1,000 Units by mouth daily.        Diagnostic Studies: Dg Knee 1-2 Views Left  February 01, 2015   CLINICAL DATA:  Preop for total knee arthroplasty.  EXAM: LEFT KNEE - 1-2 VIEW  COMPARISON:  None.  FINDINGS: Severe medial and moderate patellofemoral compartment joint space narrowing and osteophyte formation. Moderate patellofemoral osteoarthritis. No acute fracture or dislocation. No joint effusion.  IMPRESSION: Degenerative change, without acute osseous finding.   Electronically Signed   By: Abigail Miyamoto M.D.   On: 2015-02-01 16:08    Disposition: 06-Home-Health Care Svc      Discharge Instructions    Call MD / Call 911    Complete by:  As directed   If you experience chest pain or shortness of breath, CALL 911 and be transported to the hospital emergency room.  If you develope a fever above 101 F, pus (white drainage) or increased drainage or redness at the wound, or calf pain, call your surgeon's office.     Constipation Prevention    Complete by:  As directed   Drink plenty of fluids.  Prune juice may be helpful.  You may use a stool softener, such as Colace (over the counter) 100 mg twice a day.  Use MiraLax (over the counter) for constipation as needed.     Diet - low sodium heart healthy    Complete by:  As directed      Discharge instructions    Complete by:  As directed   INSTRUCTIONS AFTER JOINT REPLACEMENT   Remove items at home which could result in a  fall. This includes throw rugs or furniture in walking pathways ICE to the affected joint every three hours while awake for 30 minutes at a time, for at least the first  3-5 days, and then as needed for pain and swelling.  Continue to use ice for pain and swelling. You may notice swelling that will progress down to the foot and ankle.  This is normal after surgery.  Elevate your leg when you are not up walking on it.   Continue to use the breathing machine you got in the hospital (incentive spirometer) which will help keep your temperature down.  It is common for your temperature to cycle up and down following surgery, especially at night when you are not up moving around and exerting yourself.  The breathing machine keeps your lungs expanded and your temperature down.   DIET:  As you were doing prior to hospitalization, we recommend a well-balanced diet.  DRESSING / WOUND CARE / SHOWERING   Leave dressing in place, may remove the drain site dressing on the side. Place a band aid and neosporin over the site. May shower.  ACTIVITY  Increase activity slowly as tolerated, but follow the weight bearing instructions below.   No driving for 6 weeks or until further direction given by your physician.  You cannot drive while taking narcotics.  No lifting or carrying greater than 10 lbs. until further directed by your surgeon. Avoid periods of inactivity such as sitting longer than an hour when not asleep. This helps prevent blood clots.  You may return to work once you are authorized by your doctor.     WEIGHT BEARING   As tolerated, unless directed otherwise. Use assistive devices for safety.     EXERCISES  Results after joint replacement surgery are often greatly improved when you follow the exercise, range of motion and muscle strengthening exercises prescribed by your doctor. Safety measures are also important to protect the joint from further injury. Any time any of these exercises cause you to have increased pain or swelling, decrease what you are doing until you are comfortable again and then slowly increase them. If you have problems or questions, call  your caregiver or physical therapist for advice.   Rehabilitation is important following a joint replacement. After just a few days of immobilization, the muscles of the leg can become weakened and shrink (atrophy).  These exercises are designed to build up the tone and strength of the thigh and leg muscles and to improve motion. Often times heat used for twenty to thirty minutes before working out will loosen up your tissues and help with improving the range of motion but do not use heat for the first two weeks following surgery (sometimes heat can increase post-operative swelling).   These exercises can be done on a training (exercise) mat, on the floor, on a table or on a bed. Use whatever works the best and is most comfortable for you.    Use music or television while you are exercising so that the exercises are a pleasant break in your day. This will make your life better with the exercises acting as a break in your routine that you can look forward to.   Perform all exercises about fifteen times, three times per day or as directed.  You should exercise both the operative leg and the other leg as well.    Exercises include:   Quad Sets - Tighten up the muscle on the front of the thigh [employer])  and hold for 5-10 seconds.   Straight Leg Raises - With your knee straight (if you were given a brace, keep it on), lift the leg to 60 degrees, hold for 3 seconds, and slowly lower the leg.  Perform this exercise against resistance later as your leg gets stronger.  Leg Slides: Lying on your back, slowly slide your foot toward your buttocks, bending your knee up off the floor (only go as far as is comfortable). Then slowly slide your foot back down until your leg is flat on the floor again.  Angel Wings: Lying on your back spread your legs to the side as far apart as you can without causing discomfort.  Hamstring Strength:  Lying on your back, push your heel against the floor with your leg straight by tightening  up the muscles of your buttocks.  Repeat, but this time bend your knee to a comfortable angle, and push your heel against the floor.  You may put a pillow under the heel to make it more comfortable if necessary.   A rehabilitation program following joint replacement surgery can speed recovery and prevent re-injury in the future due to weakened muscles. Contact your doctor or a physical therapist for more information on knee rehabilitation.    CONSTIPATION  Constipation is defined medically as fewer than three stools per week and severe constipation as less than one stool per week.  Even if you have a regular bowel pattern at home, your normal regimen is likely to be disrupted due to multiple reasons following surgery.  Combination of anesthesia, postoperative narcotics, change in appetite and fluid intake all can affect your bowels.   YOU MUST use at least one of the following options; they are listed in order of increasing strength to get the job done.  They are all available over the counter, and you may need to use some, POSSIBLY even all of these options:    Drink plenty of fluids (prune juice may be helpful) and high fiber foods Colace 100 mg by mouth twice a day  Senokot for constipation as directed and as needed Dulcolax (bisacodyl), take with full glass of water  Miralax (polyethylene glycol) once or twice a day as needed.  If you have tried all these things and are unable to have a bowel movement in the first 3-4 days after surgery call either your surgeon or your primary doctor.    If you experience loose stools or diarrhea, hold the medications until you stool forms back up.  If your symptoms do not get better within 1 week or if they get worse, check with your doctor.  If you experience "the worst abdominal pain ever" or develop nausea or vomiting, please contact the office immediately for further recommendations for treatment.   ITCHING:  If you experience itching with your  medications, try taking only a single pain pill, or even half a pain pill at a time.  You can also use Benadryl over the counter for itching or also to help with sleep.   TED HOSE STOCKINGS:  Use stockings on both legs until for at least 2 weeks or as directed by physician office. They may be removed at night for sleeping.  MEDICATIONS:  See your medication summary on the "After Visit Summary" that nursing will review with you.  You may have some home medications which will be placed on hold until you complete the course of blood thinner medication.  It is important for you to complete the blood  thinner medication as prescribed.  PRECAUTIONS:  If you experience chest pain or shortness of breath - call 911 immediately for transfer to the hospital emergency department.   If you develop a fever greater that 101 F, purulent drainage from wound, increased redness or drainage from wound, foul odor from the wound/dressing, or calf pain - CONTACT YOUR SURGEON.                                                   FOLLOW-UP APPOINTMENTS:  If you do not already have a post-op appointment, please call the office [phone] for an appointment to be seen by your surgeon.  Guidelines for how soon to be seen are listed in your "After Visit Summary", but are typically between 1-4 weeks after surgery.  OTHER INSTRUCTIONS:   Knee Replacement:  Do not place pillow under knee, focus on keeping the knee straight while resting. CPM instructions: 0-90 degrees, 2 hours in the morning, 2 hours in the afternoon, and 2 hours in the evening. Place foam block, curve side up under heel at all times except when in CPM or when walking.  DO NOT modify, tear, cut, or change the foam block in any way.  MAKE SURE YOU:  Understand these instructions.  Get help right away if you are not doing well or get worse.    Thank you for letting us be a part of your medical care team.  It is a privilege we respect greatly.  We hope these  instructions will help you stay on track for a fast and full recovery!     Do not put a pillow under the knee. Place it under the heel.    Complete by:  As directed      Increase activity slowly as tolerated    Complete by:  As directed      TED hose    Complete by:  As directed   Use stockings (TED hose) for 2 weeks on both leg(s).  You may remove them at night for sleeping.           Follow-up Information    Follow up with Cynda Familia, MD. Go in 2 weeks.   Specialty:  Orthopedic Surgery   Contact information:   [address] [place] [address] [phone]        Signed: Ventura Bruns 01/21/2015, 8:01 AM

## 2015-01-23 ENCOUNTER — Encounter: Payer: Self-pay | Admitting: Adult Health

## 2015-01-23 ENCOUNTER — Non-Acute Institutional Stay (SKILLED_NURSING_FACILITY): Payer: 59 | Admitting: Adult Health

## 2015-01-23 DIAGNOSIS — E785 Hyperlipidemia, unspecified: Secondary | ICD-10-CM | POA: Diagnosis not present

## 2015-01-23 DIAGNOSIS — E039 Hypothyroidism, unspecified: Secondary | ICD-10-CM | POA: Diagnosis not present

## 2015-01-23 DIAGNOSIS — D62 Acute posthemorrhagic anemia: Secondary | ICD-10-CM

## 2015-01-23 DIAGNOSIS — M1712 Unilateral primary osteoarthritis, left knee: Secondary | ICD-10-CM | POA: Diagnosis not present

## 2015-01-23 DIAGNOSIS — F329 Major depressive disorder, single episode, unspecified: Secondary | ICD-10-CM | POA: Diagnosis not present

## 2015-01-23 DIAGNOSIS — I10 Essential (primary) hypertension: Secondary | ICD-10-CM | POA: Diagnosis not present

## 2015-01-23 DIAGNOSIS — F32A Depression, unspecified: Secondary | ICD-10-CM

## 2015-01-24 ENCOUNTER — Non-Acute Institutional Stay (SKILLED_NURSING_FACILITY): Payer: 59 | Admitting: Internal Medicine

## 2015-01-24 DIAGNOSIS — I10 Essential (primary) hypertension: Secondary | ICD-10-CM | POA: Diagnosis not present

## 2015-01-24 DIAGNOSIS — E039 Hypothyroidism, unspecified: Secondary | ICD-10-CM

## 2015-01-24 DIAGNOSIS — K219 Gastro-esophageal reflux disease without esophagitis: Secondary | ICD-10-CM

## 2015-01-24 DIAGNOSIS — M1712 Unilateral primary osteoarthritis, left knee: Secondary | ICD-10-CM

## 2015-01-24 DIAGNOSIS — E785 Hyperlipidemia, unspecified: Secondary | ICD-10-CM | POA: Diagnosis not present

## 2015-01-24 DIAGNOSIS — K5901 Slow transit constipation: Secondary | ICD-10-CM | POA: Diagnosis not present

## 2015-01-24 DIAGNOSIS — F329 Major depressive disorder, single episode, unspecified: Secondary | ICD-10-CM | POA: Diagnosis not present

## 2015-01-24 DIAGNOSIS — F32A Depression, unspecified: Secondary | ICD-10-CM

## 2015-01-24 DIAGNOSIS — D62 Acute posthemorrhagic anemia: Secondary | ICD-10-CM

## 2015-01-24 NOTE — Progress Notes (Signed)
Patient ID: Jamie Espinoza, female   DOB: [birth year], 64 y.o.   MRN: [id number]     [facility]   PCP: Gerrit Heck, MD  Code Status: full code  No Known Allergies  Chief Complaint  Patient presents with  . New Admit To SNF     HPI:  64 year old patient is here for short term rehabilitation post hospital admission from 01/19/15-01/21/15 with left knee osteoarthritis. She underwent left total knee arthroplasty. She is seen in her room today. Her pain is under control. She denies any concerns. She has pmh of OA, hypothyroidism, HLD among others.  Review of Systems:  Constitutional: Negative for fever, chills, diaphoresis.  HENT: Negative for headache, congestion, nasal discharge Respiratory: Negative for cough, shortness of breath and wheezing.   Cardiovascular: Negative for chest pain, palpitations, leg swelling.  Gastrointestinal: Negative for heartburn, nausea, vomiting, abdominal pain. Appetite is picking uo. Had bowel movement yesterday. Genitourinary: Negative for dysuria Musculoskeletal: Negative for back pain, falls Skin: Negative for itching, rash.  Neurological: Negative for dizziness, tingling, focal weakness Psychiatric/Behavioral: Negative for depression   Past Medical History  Diagnosis Date  . Sleep apnea     uses CPAP  . Hypothyroidism   . Hypertension     Does not see cardiologist  . High cholesterol   . GERD (gastroesophageal reflux disease)   . Anxiety   . History of anemia     d/t heavy menstrual bleeding  . DJD (degenerative joint disease)   . Osteopenia   . History of nonmelanoma skin cancer     NOSE  . Constipation     OCCASIONAL  . History of kidney stones    Past Surgical History  Procedure Laterality Date  . Carpal tunnel release Bilateral     2014  . Bunionectomy  2011    with toe fusion and screw  . Meniscus repair Right 1999  . Right ankle surgery  1969  . Tubal ligation  1981  .  Tonsillectomy      as a child  . Total knee arthroplasty Right 04/06/2013    Procedure: TOTAL KNEE ARTHROPLASTY;  Surgeon: Hessie Dibble, MD;  Location: [facility];  Service: Orthopedics;  Laterality: Right;  . Knee arthroscopy  2015    LEFT KNEE  . Total knee arthroplasty Left 01/19/2015    Procedure: LEFT TOTAL KNEE ARTHROPLASTY;  Surgeon: Sydnee Cabal, MD;  Location: [facility];  Service: Orthopedics;  Laterality: Left;   Social History:   reports that she quit smoking about 24 years ago. She has never used smokeless tobacco. She reports that she drinks alcohol. She reports that she does not use illicit drugs.  No family history on file.  Medications: Patient's Medications  New Prescriptions   No medications on file  Previous Medications   ASCORBIC ACID (VITAMIN C) 1000 MG TABLET    Take 1,000 mg by mouth daily.   ASPIRIN EC 325 MG TABLET    Take 1 tablet (325 mg total) by mouth 2 (two) times daily.   B COMPLEX-C (B-COMPLEX WITH VITAMIN C) TABLET    Take 1 tablet by mouth at bedtime.   BUPROPION (WELLBUTRIN XL) 300 MG 24 HR TABLET    Take 300 mg by mouth daily.   CALCIUM-MAGNESIUM-ZINC 333.33-133.33-5 MG TABS    Take 1 tablet by mouth 2 (two) times daily.   CELECOXIB (CELEBREX) 200 MG CAPSULE    Take 200 mg by mouth daily.   CHOLECALCIFEROL (  VITAMIN D) 1000 UNITS TABLET    Take 1,000 Units by mouth 2 (two) times daily.   FEXOFENADINE (MUCINEX ALLERGY) 180 MG TABLET    Take 180 mg by mouth daily.   HYDROCODONE-ACETAMINOPHEN (NORCO) 7.5-325 MG PER TABLET    Take 1-2 tablets by mouth every 4 (four) hours as needed.   LEVOTHYROXINE (SYNTHROID, LEVOTHROID) 75 MCG TABLET    Take 75 mcg by mouth at bedtime.   LOSARTAN-HYDROCHLOROTHIAZIDE (HYZAAR) 50-12.5 MG PER TABLET    Take 1 tablet by mouth at bedtime.   METHOCARBAMOL (ROBAXIN) 500 MG TABLET    Take 1 tablet (500 mg total) by mouth every 6 (six) hours as needed.   MULTIPLE VITAMIN (MULTIVITAMIN WITH MINERALS) TABS TABLET    Take 1 tablet by  mouth at bedtime.   NIACIN (NIASPAN) 1000 MG CR TABLET    Take 1,000 mg by mouth at bedtime.   OMEGA-3 ACID ETHYL ESTERS (LOVAZA) 1 G CAPSULE    Take 2 g by mouth 2 (two) times daily.   OMEPRAZOLE (PRILOSEC) 20 MG CAPSULE    Take 20 mg by mouth at bedtime.   SIMVASTATIN (ZOCOR) 20 MG TABLET    Take 20 mg by mouth daily.   TETRAHYDROZOLINE-ZINC (VISINE-AC) 0.05-0.25 % OPHTHALMIC SOLUTION    Place 2 drops into both eyes 3 (three) times daily as needed (itching and redness of eyes).   VITAMIN E 1000 UNIT CAPSULE    Take 1,000 Units by mouth daily.  Modified Medications   No medications on file  Discontinued Medications   No medications on file     Physical Exam: Filed Vitals:   01/24/15 1514  BP: 137/78  Pulse: 84  Temp: 97.1 F (36.2 C)  Resp: 16  Weight: 221 lb 9.6 oz (100.517 kg)  SpO2: 96%    General- 64 female, obese, in no acute distress Head- normocephalic, atraumatic Throat- moist mucus membrane Eyes- PERRLA, EOMI, no pallor, no icterus, no discharge, normal conjunctiva, normal sclera Neck- no cervical lymphadenopathy Cardiovascular- normal s1,s2, no murmurs, palpable dorsalis pedis and radial pulses, left leg edema Respiratory- bilateral clear to auscultation, no wheeze, no rhonchi, no crackles, no use of accessory muscles Abdomen- bowel sounds present, soft, non tender Musculoskeletal- able to move all 4 extremities,left leg range of motion restricted Neurological- no focal deficit Skin- warm and dry, left knee aquacel dressing, ted hose in place Psychiatry- alert and oriented to person, place and time, normal mood and affect    Labs reviewed: Basic Metabolic Panel:  Recent Labs  01/10/15 0955 01/20/15 0443  NA 142 136  K 4.8 3.9  CL 104 102  CO2 30 27  GLUCOSE 101* 183*  BUN 16 11  CREATININE 0.68 0.68  CALCIUM 9.5 8.4*   CBC:  Recent Labs  01/10/15 0955 01/20/15 0443 01/21/15 0505  WBC 6.0 11.7* 10.2  HGB 12.4 10.7* 10.4*  HCT 38.7 32.6*  31.5*  MCV 90.0 88.1 89.5  PLT 204 152 177     Assessment/Plan  Left knee Osteoarthritis  S/P left total knee arthroplasty. Will have her work with physical therapy and occupational therapy team to help with gait training and muscle strengthening exercises.fall precautions. Skin care. Encourage to be out of bed. Continue aspirin 325 mg bid for DVT prophylaxis. Has follow up with orthopedics. Continue Celebrex 200 mg daily and Norco 7.5/325 mg 1-2 tabs q4h prn pain. Continue robaxin for muscle spasm. WBAT, ted hose.  Acute blood loss anemia Post op, monitor h&h, hb 10.4 at  discharge  Constipation Continue senna s 2 tab bid and miralax daily  HTN Stable bp, continue hyzaar 50-12.5 mg daily, monitor bp  Hypothyroidism Continue levothyroxine 75 mcg daily, check tsh No results found for: TSH  gerd Stable, continue omeprazole 20 mg daily  Depression continue Wellbutrin XL 300 mg daily  Hyperlipidemia continue simvastatin 20 mg daily   Goals of care: short term rehabilitation   Labs/tests ordered: tsh, cbc  Family/ staff Communication: reviewed care plan with patient and nursing supervisor    Blanchie Serve, MD  [facility] [phone] (Monday-Friday 8 am - 5 pm) [phone] (afterhours)

## 2015-01-24 NOTE — Progress Notes (Signed)
Patient ID: AYAUNA MCNAY, female   DOB: 1951-07-28, 64 y.o.   MRN: [id number]   01/23/15  Facility:  Nursing Home Location:  [place] Room Number: 947-M LEVEL OF CARE:  SNF (31)   Chief Complaint  Patient presents with  . Hospitalization Follow-up    Osteoarthritis S/P left total knee arthroplasty, depression, hypothyroidism, hypertension, hyperlipidemia and anemia    HISTORY OF PRESENT ILLNESS:  This is a 64 year old female who has been admitted to [facility] on 01/21/15 from [facility] with osteoarthritis S/P left total knee arthroplasty. She has PMH of sleep apnea, hypothyroidism, hypertension, hyperlipidemia, GERD, anxiety, carpal tunnel syndrome and anemia.  She has been admitted for a short-term rehabilitation.  PAST MEDICAL HISTORY:  Past Medical History  Diagnosis Date  . Sleep apnea     uses CPAP  . Hypothyroidism   . Hypertension     Does not see cardiologist  . High cholesterol   . GERD (gastroesophageal reflux disease)   . Anxiety   . History of anemia     d/t heavy menstrual bleeding  . DJD (degenerative joint disease)   . Osteopenia   . History of nonmelanoma skin cancer     NOSE  . Constipation     OCCASIONAL  . History of kidney stones     CURRENT MEDICATIONS: Reviewed per MAR/see medication list  No Known Allergies   REVIEW OF SYSTEMS:  GENERAL: no change in appetite, no fatigue, no weight changes, no fever, chills or weakness RESPIRATORY: no cough, SOB, DOE, wheezing, hemoptysis CARDIAC: no chest pain, edema or palpitations GI: no abdominal pain, diarrhea, heart burn, nausea or vomiting, + constipation  PHYSICAL EXAMINATION  GENERAL: no acute distress, obese SKIN:  Left knee surgical wound has Aquasol dressing, dry, no erythema EYES: conjunctivae normal, sclerae normal, normal eye lids NECK: supple, trachea midline, no neck masses, no thyroid tenderness, no thyromegaly LYMPHATICS: no LAN in the  neck, no supraclavicular LAN RESPIRATORY: breathing is even & unlabored, BS CTAB CARDIAC: RRR, no murmur,no extra heart sounds, no edema GI: abdomen soft, normal BS, no masses, no tenderness, no hepatomegaly, no splenomegaly EXTREMITIES:  Able to move 4 extremities PSYCHIATRIC: the patient is alert & oriented to person, affect & behavior appropriate  LABS/RADIOLOGY: Labs reviewed: Basic Metabolic Panel:  Recent Labs  01/10/15 0955 01/20/15 0443  NA 142 136  K 4.8 3.9  CL 104 102  CO2 30 27  GLUCOSE 101* 183*  BUN 16 11  CREATININE 0.68 0.68  CALCIUM 9.5 8.4*   CBC:  Recent Labs  01/10/15 0955 01/20/15 0443 01/21/15 0505  WBC 6.0 11.7* 10.2  HGB 12.4 10.7* 10.4*  HCT 38.7 32.6* 31.5*  MCV 90.0 88.1 89.5  PLT 204 152 177     Dg Knee 1-2 Views Left  01/19/2015   CLINICAL DATA:  Preop for total knee arthroplasty.  EXAM: LEFT KNEE - 1-2 VIEW  COMPARISON:  None.  FINDINGS: Severe medial and moderate patellofemoral compartment joint space narrowing and osteophyte formation. Moderate patellofemoral osteoarthritis. No acute fracture or dislocation. No joint effusion.  IMPRESSION: Degenerative change, without acute osseous finding.   Electronically Signed   By: Abigail Miyamoto M.D.   On: 01/19/2015 16:08    ASSESSMENT/PLAN:  Osteoarthritis S/P left total knee arthroplasty - for rehabilitation; follow-up with Dr. Theda Sers, orthopedic surgeon, in 2 weeks; continue aspirin 325 mg by mouth twice a day for DVT prophylaxis; and Celebrex 200 mg 1 capsule by  mouth daily and Norco 7.5/325 mg 1-2 tabs by mouth every 4 hours when necessary for pain Depression - mood is stable; continue Wellbutrin XL 300 mg by mouth daily Hypothyroidism - continue Synthroid 75 g by mouth daily Hypertension - continue Hyzaar 50-12.5 mg 1 tab by mouth daily at bedtime Hyperlipidemia - continue simvastatin 20 mg 1 tab by mouth daily Anemia, acute blood loss - hemoglobin 10.4; will monitor   Goals of care:   Short-term rehabilitation   Labs/test ordered:  TSH, CMP and CBC   Spent 50 minutes in patient care.   [facility], NP [employer] [phone]

## 2015-01-25 ENCOUNTER — Encounter: Payer: Self-pay | Admitting: Adult Health

## 2015-01-25 ENCOUNTER — Non-Acute Institutional Stay (SKILLED_NURSING_FACILITY): Payer: 59 | Admitting: Adult Health

## 2015-01-25 DIAGNOSIS — F329 Major depressive disorder, single episode, unspecified: Secondary | ICD-10-CM | POA: Diagnosis not present

## 2015-01-25 DIAGNOSIS — I10 Essential (primary) hypertension: Secondary | ICD-10-CM | POA: Diagnosis not present

## 2015-01-25 DIAGNOSIS — E039 Hypothyroidism, unspecified: Secondary | ICD-10-CM | POA: Diagnosis not present

## 2015-01-25 DIAGNOSIS — D62 Acute posthemorrhagic anemia: Secondary | ICD-10-CM

## 2015-01-25 DIAGNOSIS — K219 Gastro-esophageal reflux disease without esophagitis: Secondary | ICD-10-CM | POA: Diagnosis not present

## 2015-01-25 DIAGNOSIS — M1712 Unilateral primary osteoarthritis, left knee: Secondary | ICD-10-CM | POA: Diagnosis not present

## 2015-01-25 DIAGNOSIS — K5901 Slow transit constipation: Secondary | ICD-10-CM

## 2015-01-25 DIAGNOSIS — F32A Depression, unspecified: Secondary | ICD-10-CM

## 2015-01-25 DIAGNOSIS — E785 Hyperlipidemia, unspecified: Secondary | ICD-10-CM | POA: Diagnosis not present

## 2015-01-25 NOTE — Progress Notes (Signed)
Patient ID: Jamie Espinoza, female   DOB: 07-20-1951, 64 y.o.   MRN: [id number]   01/25/15  Facility:  Nursing Home Location:  [place] Room Number: 371-I LEVEL OF CARE:  SNF (31)   Chief Complaint  Patient presents with  . Discharge Note    Osteoarthritis S/P left total knee arthroplasty, depression, hypothyroidism, hypertension, constipation, GERD, hyperlipidemia and anemia    HISTORY OF PRESENT ILLNESS:  This is a 64 year old female who is for discharge home with Home health PT for endurance. She will go home with medications and scripts. DME:  Rolling walker. She has been admitted to [facility] on 01/21/15 from [facility] with osteoarthritis S/P left total knee arthroplasty. She has PMH of sleep apnea, hypothyroidism, hypertension, hyperlipidemia, GERD, anxiety, carpal tunnel syndrome and anemia.  Patient was admitted to this facility for short-term rehabilitation after the patient's recent hospitalization.  Patient has completed SNF rehabilitation and therapy has cleared the patient for discharge.  PAST MEDICAL HISTORY:  Past Medical History  Diagnosis Date  . Sleep apnea     uses CPAP  . Hypothyroidism   . Hypertension     Does not see cardiologist  . High cholesterol   . GERD (gastroesophageal reflux disease)   . Anxiety   . History of anemia     d/t heavy menstrual bleeding  . DJD (degenerative joint disease)   . Osteopenia   . History of nonmelanoma skin cancer     NOSE  . Constipation     OCCASIONAL  . History of kidney stones     CURRENT MEDICATIONS: Reviewed per MAR/see medication list  No Known Allergies   REVIEW OF SYSTEMS:  GENERAL: no change in appetite, no fatigue, no weight changes, no fever, chills or weakness RESPIRATORY: no cough, SOB, DOE, wheezing, hemoptysis CARDIAC: no chest pain, edema or palpitations GI: no abdominal pain, diarrhea, heart burn, nausea or vomiting  PHYSICAL EXAMINATION  GENERAL:  no acute distress, obese SKIN:  Left knee surgical wound has Aquacel dressing, dry, no erythema NECK: supple, trachea midline, no neck masses, no thyroid tenderness, no thyromegaly LYMPHATICS: no LAN in the neck, no supraclavicular LAN RESPIRATORY: breathing is even & unlabored, BS CTAB CARDIAC: RRR, no murmur,no extra heart sounds, no edema GI: abdomen soft, normal BS, no masses, no tenderness, no hepatomegaly, no splenomegaly EXTREMITIES:  Able to move 4 extremities PSYCHIATRIC: the patient is alert & oriented to person, affect & behavior appropriate  LABS/RADIOLOGY: Labs reviewed: 01/24/15  WBC 7.1 hemoglobin 9.4 hematocrit 28.2 MCV 86.2 platelet 209 sodium 139 potassium 4.6 glucose 111 BUN 17 creatinine 0.58 total bilirubin 0.5 alkaline phosphatase 77 SGOT 32 SGPT 36 total protein 5.6 albumin 3.3 calcium 8.7 TSH 9.678 Basic Metabolic Panel:  Recent Labs  01/10/15 0955 01/20/15 0443  NA 142 136  K 4.8 3.9  CL 104 102  CO2 30 27  GLUCOSE 101* 183*  BUN 16 11  CREATININE 0.68 0.68  CALCIUM 9.5 8.4*   CBC:  Recent Labs  01/10/15 0955 01/20/15 0443 01/21/15 0505  WBC 6.0 11.7* 10.2  HGB 12.4 10.7* 10.4*  HCT 38.7 32.6* 31.5*  MCV 90.0 88.1 89.5  PLT 204 152 177     Dg Knee 1-2 Views Left  01/19/2015   CLINICAL DATA:  Preop for total knee arthroplasty.  EXAM: LEFT KNEE - 1-2 VIEW  COMPARISON:  None.  FINDINGS: Severe medial and moderate patellofemoral compartment joint space narrowing and osteophyte formation. Moderate  patellofemoral osteoarthritis. No acute fracture or dislocation. No joint effusion.  IMPRESSION: Degenerative change, without acute osseous finding.   Electronically Signed   By: Abigail Miyamoto M.D.   On: 01/19/2015 16:08    ASSESSMENT/PLAN:  Osteoarthritis S/P left total knee arthroplasty - for Home health PT; follow-up with Dr. Theda Sers, orthopedic surgeon, in 1 week; continue aspirin 325 mg by mouth twice a day for DVT prophylaxis; and Celebrex 200 mg 1  capsule by mouth daily and Norco 7.5/325 mg 1-2 tabs by mouth every 4 hours when necessary for pain Depression - mood is stable; continue Wellbutrin XL 300 mg by mouth daily Hypothyroidism - tsh 7.174; increase Synthroid to 88 g by mouth daily Hypertension - continue Hyzaar 50-12.5 mg 1 tab by mouth daily at bedtime Hyperlipidemia - continue simvastatin 20 mg 1 tab by mouth daily Anemia, acute blood loss - hemoglobin 9.4; continue FeSO4 325 mg 1 tab PO Q D Constipation - continue Senna-s 2 tabs PO BID GERD - continue Prilosec 20 mg PO Q D   I have filled out patient's discharge paperwork and written prescriptions.  Patient will receive home health PT.  DME provided:  Rolling walker  Total discharge time: Greater than 30 minutes  Discharge time involved coordination of the discharge process with social worker, nursing staff and therapy department. Medical justification for home health services/DME verified.   [facility], NP [employer] [phone]

## 2015-03-02 ENCOUNTER — Other Ambulatory Visit: Payer: Self-pay

## 2015-03-02 DIAGNOSIS — Z1231 Encounter for screening mammogram for malignant neoplasm of breast: Secondary | ICD-10-CM

## 2015-03-27 ENCOUNTER — Ambulatory Visit
Admission: RE | Admit: 2015-03-27 | Discharge: 2015-03-27 | Disposition: A | Discharge status: Home / Self Care | Payer: 59 | Source: Ambulatory Visit

## 2015-03-27 DIAGNOSIS — Z1231 Encounter for screening mammogram for malignant neoplasm of breast: Secondary | ICD-10-CM

## 2015-04-05 ENCOUNTER — Other Ambulatory Visit: Payer: Self-pay | Admitting: Family Medicine

## 2015-04-05 DIAGNOSIS — R928 Other abnormal and inconclusive findings on diagnostic imaging of breast: Secondary | ICD-10-CM

## 2015-04-12 ENCOUNTER — Ambulatory Visit
Admission: RE | Admit: 2015-04-12 | Discharge: 2015-04-12 | Disposition: A | Discharge status: Home / Self Care | Payer: 59 | Source: Ambulatory Visit | Attending: Family Medicine | Admitting: Family Medicine

## 2015-04-12 ENCOUNTER — Other Ambulatory Visit: Payer: Self-pay | Admitting: Family Medicine

## 2015-04-12 DIAGNOSIS — R921 Mammographic calcification found on diagnostic imaging of breast: Secondary | ICD-10-CM

## 2015-04-12 DIAGNOSIS — R928 Other abnormal and inconclusive findings on diagnostic imaging of breast: Secondary | ICD-10-CM

## 2015-04-13 ENCOUNTER — Ambulatory Visit
Admission: RE | Admit: 2015-04-13 | Discharge: 2015-04-13 | Disposition: A | Discharge status: Home / Self Care | Payer: 59 | Source: Ambulatory Visit | Attending: Family Medicine | Admitting: Family Medicine

## 2015-04-13 DIAGNOSIS — R921 Mammographic calcification found on diagnostic imaging of breast: Secondary | ICD-10-CM

## 2016-03-26 ENCOUNTER — Other Ambulatory Visit: Payer: Self-pay | Admitting: Family Medicine

## 2016-03-26 DIAGNOSIS — Z1231 Encounter for screening mammogram for malignant neoplasm of breast: Secondary | ICD-10-CM

## 2016-04-18 IMAGING — CR DG KNEE 1-2V*L*
3 series · 3 of 3 positions shown · non-contrast
Comparison: None.

CLINICAL DATA: Preop for total knee arthroplasty.

EXAM:
LEFT KNEE - 1-2 VIEW

[w knee lat. left]
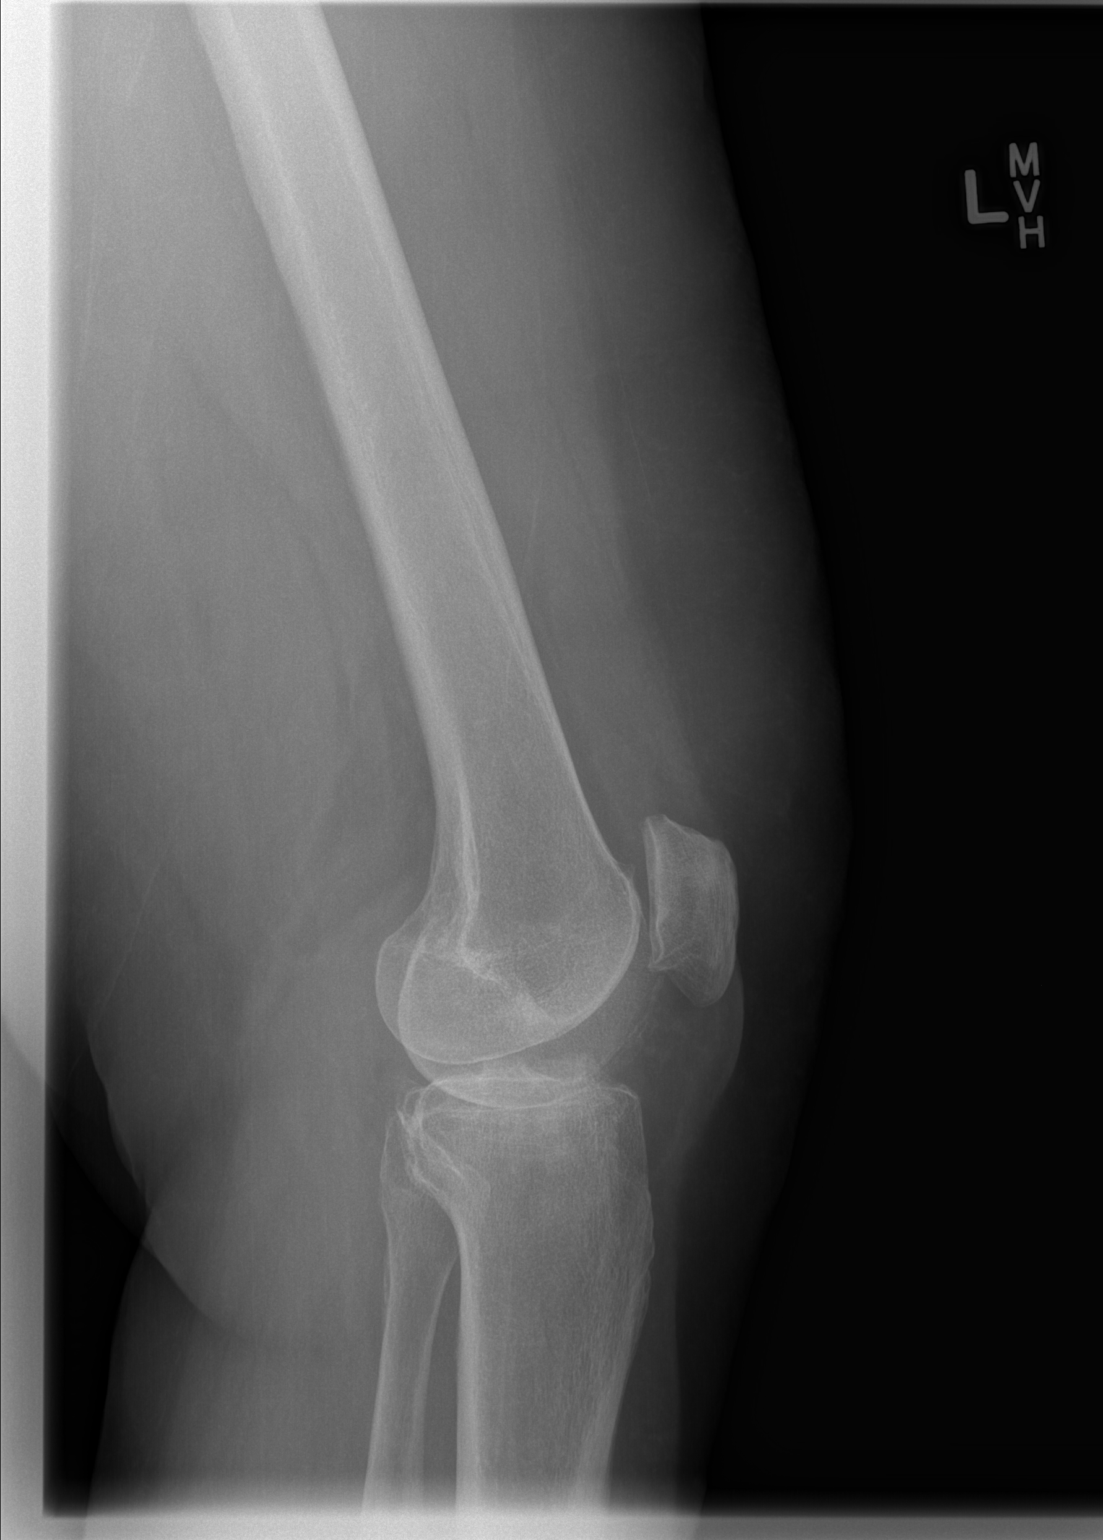

[w knee ap left (1 of 2)]
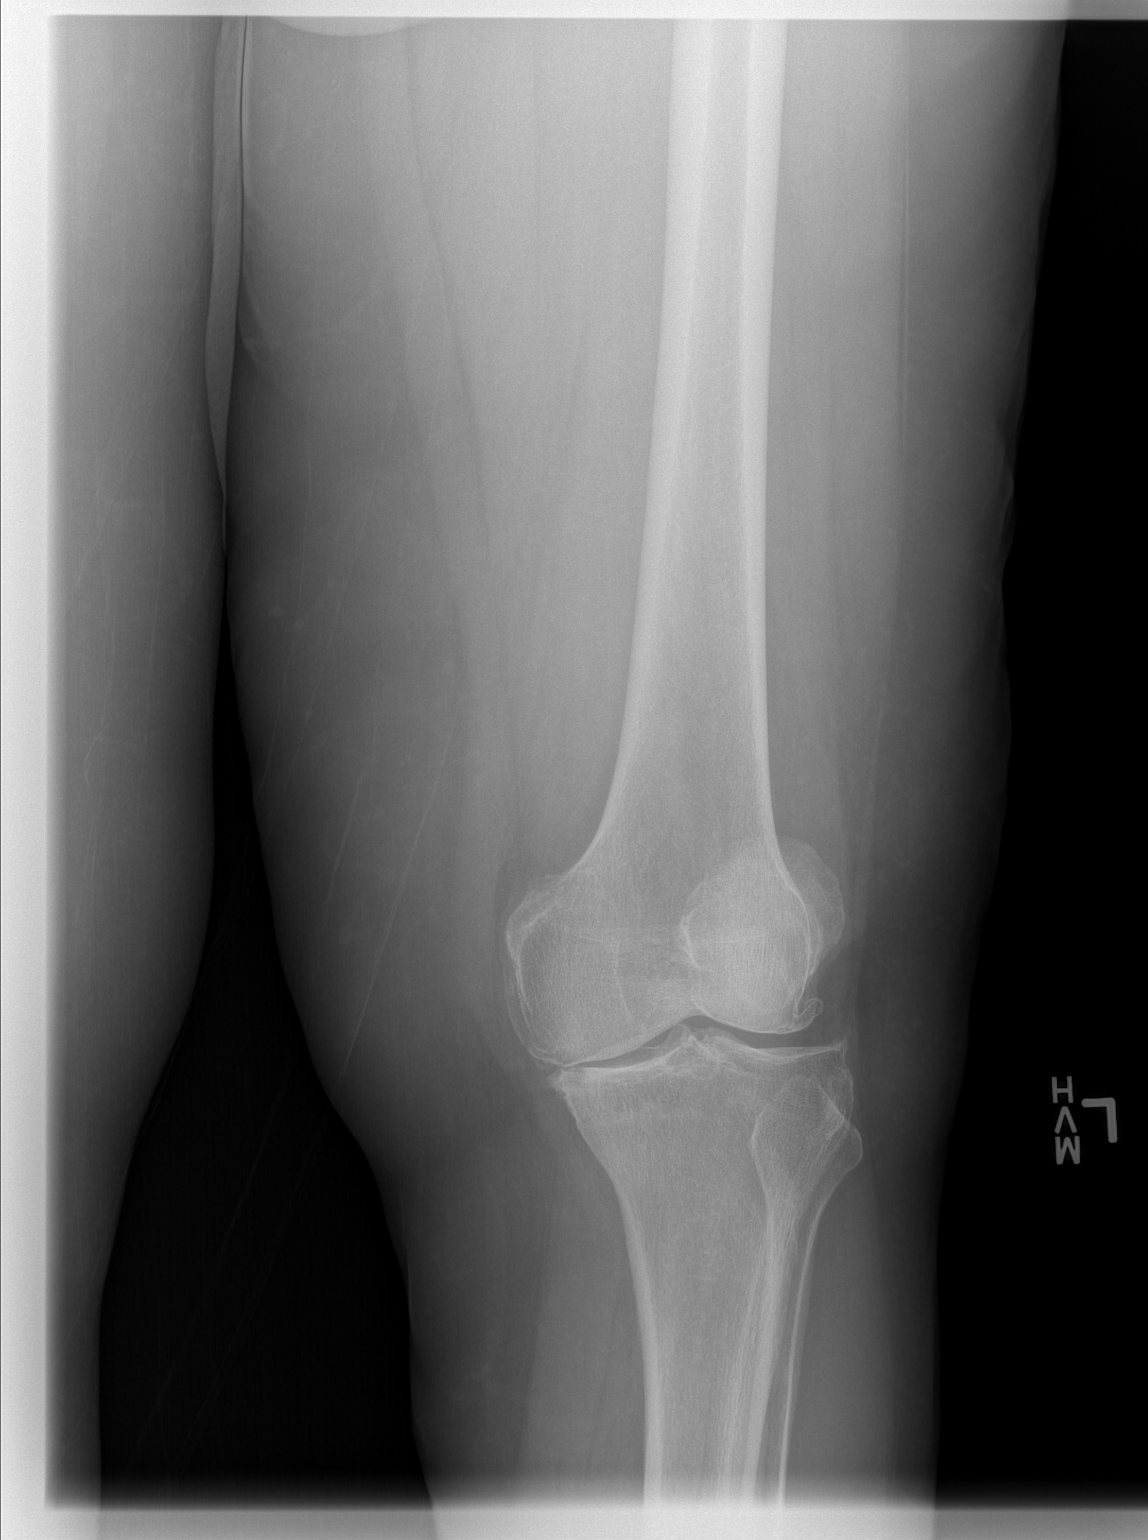

[w knee ap left (2 of 2)]
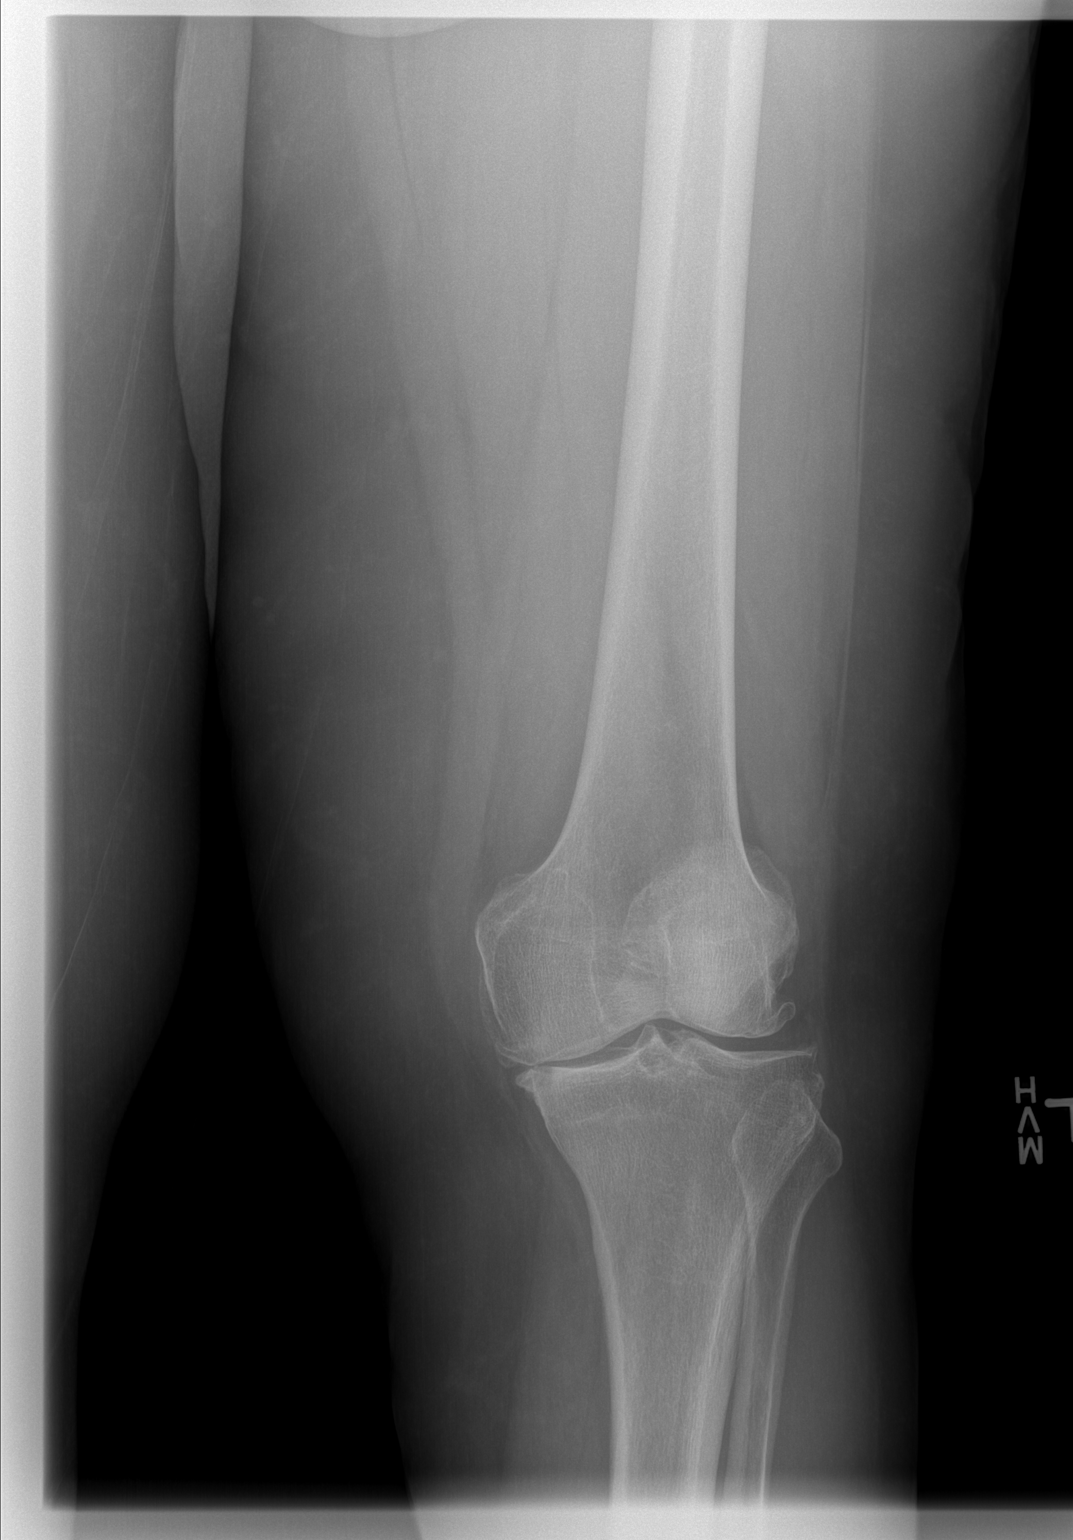

[3 of 3 positions shown; findings below may reference images not displayed]

FINDINGS: Severe medial and moderate patellofemoral compartment joint space
narrowing and osteophyte formation. Moderate patellofemoral
osteoarthritis. No acute fracture or dislocation. No joint effusion.
IMPRESSION: Degenerative change, without acute osseous finding.

## 2016-04-30 ENCOUNTER — Ambulatory Visit
Admission: RE | Admit: 2016-04-30 | Discharge: 2016-04-30 | Disposition: A | Discharge status: Home / Self Care | Payer: 59 | Source: Ambulatory Visit | Attending: Family Medicine | Admitting: Family Medicine

## 2016-04-30 DIAGNOSIS — Z1231 Encounter for screening mammogram for malignant neoplasm of breast: Secondary | ICD-10-CM

## 2016-05-01 ENCOUNTER — Other Ambulatory Visit: Payer: Self-pay | Admitting: Physician Assistant

## 2016-09-23 DIAGNOSIS — Z4789 Encounter for other orthopedic aftercare: Secondary | ICD-10-CM | POA: Diagnosis not present

## 2016-09-23 DIAGNOSIS — Z471 Aftercare following joint replacement surgery: Secondary | ICD-10-CM | POA: Diagnosis not present

## 2016-09-23 DIAGNOSIS — M24662 Ankylosis, left knee: Secondary | ICD-10-CM | POA: Diagnosis not present

## 2016-09-23 DIAGNOSIS — Z96652 Presence of left artificial knee joint: Secondary | ICD-10-CM | POA: Diagnosis not present

## 2016-12-05 DIAGNOSIS — M65331 Trigger finger, right middle finger: Secondary | ICD-10-CM | POA: Diagnosis not present

## 2017-01-02 DIAGNOSIS — M65331 Trigger finger, right middle finger: Secondary | ICD-10-CM | POA: Diagnosis not present

## 2017-01-03 DIAGNOSIS — Z4789 Encounter for other orthopedic aftercare: Secondary | ICD-10-CM | POA: Diagnosis not present

## 2017-01-03 DIAGNOSIS — M170 Bilateral primary osteoarthritis of knee: Secondary | ICD-10-CM | POA: Diagnosis not present

## 2017-01-15 DIAGNOSIS — G4733 Obstructive sleep apnea (adult) (pediatric): Secondary | ICD-10-CM | POA: Diagnosis not present

## 2017-03-06 DIAGNOSIS — R7301 Impaired fasting glucose: Secondary | ICD-10-CM | POA: Diagnosis not present

## 2017-03-07 DIAGNOSIS — J019 Acute sinusitis, unspecified: Secondary | ICD-10-CM | POA: Diagnosis not present

## 2017-04-15 DIAGNOSIS — J329 Chronic sinusitis, unspecified: Secondary | ICD-10-CM | POA: Diagnosis not present

## 2017-05-13 DIAGNOSIS — J302 Other seasonal allergic rhinitis: Secondary | ICD-10-CM | POA: Diagnosis not present

## 2017-05-13 DIAGNOSIS — R05 Cough: Secondary | ICD-10-CM | POA: Diagnosis not present

## 2017-05-22 DIAGNOSIS — Z23 Encounter for immunization: Secondary | ICD-10-CM | POA: Diagnosis not present

## 2017-06-19 ENCOUNTER — Other Ambulatory Visit: Payer: Self-pay | Admitting: Family Medicine

## 2017-06-19 DIAGNOSIS — Z1231 Encounter for screening mammogram for malignant neoplasm of breast: Secondary | ICD-10-CM

## 2017-07-04 ENCOUNTER — Other Ambulatory Visit: Payer: Self-pay | Admitting: Physician Assistant

## 2017-07-04 DIAGNOSIS — C44319 Basal cell carcinoma of skin of other parts of face: Secondary | ICD-10-CM | POA: Diagnosis not present

## 2017-07-07 DIAGNOSIS — E7800 Pure hypercholesterolemia, unspecified: Secondary | ICD-10-CM | POA: Diagnosis not present

## 2017-07-07 DIAGNOSIS — E559 Vitamin D deficiency, unspecified: Secondary | ICD-10-CM | POA: Diagnosis not present

## 2017-07-07 DIAGNOSIS — R7301 Impaired fasting glucose: Secondary | ICD-10-CM | POA: Diagnosis not present

## 2017-07-07 DIAGNOSIS — E039 Hypothyroidism, unspecified: Secondary | ICD-10-CM | POA: Diagnosis not present

## 2017-07-08 DIAGNOSIS — F419 Anxiety disorder, unspecified: Secondary | ICD-10-CM | POA: Diagnosis not present

## 2017-07-08 DIAGNOSIS — J302 Other seasonal allergic rhinitis: Secondary | ICD-10-CM | POA: Diagnosis not present

## 2017-07-08 DIAGNOSIS — E559 Vitamin D deficiency, unspecified: Secondary | ICD-10-CM | POA: Diagnosis not present

## 2017-07-08 DIAGNOSIS — E7800 Pure hypercholesterolemia, unspecified: Secondary | ICD-10-CM | POA: Diagnosis not present

## 2017-07-08 DIAGNOSIS — I10 Essential (primary) hypertension: Secondary | ICD-10-CM | POA: Diagnosis not present

## 2017-07-08 DIAGNOSIS — Z0000 Encounter for general adult medical examination without abnormal findings: Secondary | ICD-10-CM | POA: Diagnosis not present

## 2017-07-08 DIAGNOSIS — Z23 Encounter for immunization: Secondary | ICD-10-CM | POA: Diagnosis not present

## 2017-07-08 DIAGNOSIS — F339 Major depressive disorder, recurrent, unspecified: Secondary | ICD-10-CM | POA: Diagnosis not present

## 2017-07-08 DIAGNOSIS — E039 Hypothyroidism, unspecified: Secondary | ICD-10-CM | POA: Diagnosis not present

## 2017-07-08 DIAGNOSIS — G4733 Obstructive sleep apnea (adult) (pediatric): Secondary | ICD-10-CM | POA: Diagnosis not present

## 2017-07-08 DIAGNOSIS — R7301 Impaired fasting glucose: Secondary | ICD-10-CM | POA: Diagnosis not present

## 2017-07-08 DIAGNOSIS — M8588 Other specified disorders of bone density and structure, other site: Secondary | ICD-10-CM | POA: Diagnosis not present

## 2017-07-11 DIAGNOSIS — H524 Presbyopia: Secondary | ICD-10-CM | POA: Diagnosis not present

## 2017-07-11 DIAGNOSIS — H2513 Age-related nuclear cataract, bilateral: Secondary | ICD-10-CM | POA: Diagnosis not present

## 2017-07-11 DIAGNOSIS — H5202 Hypermetropia, left eye: Secondary | ICD-10-CM | POA: Diagnosis not present

## 2017-07-16 ENCOUNTER — Ambulatory Visit: Payer: Self-pay

## 2017-08-14 ENCOUNTER — Ambulatory Visit (INDEPENDENT_AMBULATORY_CARE_PROVIDER_SITE_OTHER): Payer: Medicare Other | Admitting: Obstetrics & Gynecology

## 2017-08-14 ENCOUNTER — Ambulatory Visit
Admission: RE | Admit: 2017-08-14 | Discharge: 2017-08-14 | Disposition: A | Discharge status: Home / Self Care | Payer: Medicare Other | Source: Ambulatory Visit | Attending: Family Medicine | Admitting: Family Medicine

## 2017-08-14 ENCOUNTER — Encounter: Payer: Self-pay | Admitting: Obstetrics & Gynecology

## 2017-08-14 VITALS — BP 132/86 | Ht 62.0 in | Wt 212.6 lb

## 2017-08-14 DIAGNOSIS — Z01411 Encounter for gynecological examination (general) (routine) with abnormal findings: Secondary | ICD-10-CM

## 2017-08-14 DIAGNOSIS — Z124 Encounter for screening for malignant neoplasm of cervix: Secondary | ICD-10-CM | POA: Diagnosis not present

## 2017-08-14 DIAGNOSIS — Z13820 Encounter for screening for osteoporosis: Secondary | ICD-10-CM | POA: Diagnosis not present

## 2017-08-14 DIAGNOSIS — Z780 Asymptomatic menopausal state: Secondary | ICD-10-CM

## 2017-08-14 DIAGNOSIS — Z1231 Encounter for screening mammogram for malignant neoplasm of breast: Secondary | ICD-10-CM

## 2017-08-14 DIAGNOSIS — N952 Postmenopausal atrophic vaginitis: Secondary | ICD-10-CM

## 2017-08-14 MED ORDER — ESTRADIOL 10 MCG VA TABS
1.0000 | ORAL_TABLET | VAGINAL | 4 refills | Status: DC
Start: 2017-08-14 — End: 2017-08-18

## 2017-08-14 NOTE — Patient Instructions (Signed)
1. Encounter for gynecological examination with abnormal finding Gynecologic exam with relaxation of pelvic floor, a symptomatic uterine prolapse and cystocele.  Pap reflex done.  Breast exam normal.  Screening mammogram done today, pending results.  Will schedule bone density at the breast center.  Colonoscopy 2017.  Health labs with family physician.  2. Menopause present Well on no hormone replacement therapy.  No postmenopausal bleeding.  3. Post-menopausal atrophic vaginitis Will restart on Vagifem and testosterone cream prior to resuming sexual activity in a few weeks.  Prescription reordered for both.  4. Screening for osteoporosis Vitamin D supplements, calcium rich nutrition and regular weightbearing physical activity recommended. - DG Bone Density; Future  5. Screening for malignant neoplasm of cervix - Pap IG w/ reflex to HPV when ASC-U  Other orders - Estradiol 10 MCG TABS vaginal tablet; Place 1 tablet (10 mcg total) vaginally 2 (two) times a week. - Testosterone 0.2% cream apply 1/2 to 1 mL topically once a day called to [place].  Jamie Espinoza, it was a pleasure seeing you today!  I will inform you of your results as soon as they are available.   Health Maintenance for Postmenopausal Women Menopause is a normal process in which your reproductive ability comes to an end. This process happens gradually over a span of months to years, usually between the ages of 10 and 2. Menopause is complete when you have missed 12 consecutive menstrual periods. It is important to talk with your health care provider about some of the most common conditions that affect postmenopausal women, such as heart disease, cancer, and bone loss (osteoporosis). Adopting a healthy lifestyle and getting preventive care can help to promote your health and wellness. Those actions can also lower your chances of developing some of these common conditions. What should I know about menopause? During  menopause, you may experience a number of symptoms, such as:  Moderate-to-severe hot flashes.  Night sweats.  Decrease in sex drive.  Mood swings.  Headaches.  Tiredness.  Irritability.  Memory problems.  Insomnia.  Choosing to treat or not to treat menopausal changes is an individual decision that you make with your health care provider. What should I know about hormone replacement therapy and supplements? Hormone therapy products are effective for treating symptoms that are associated with menopause, such as hot flashes and night sweats. Hormone replacement carries certain risks, especially as you become older. If you are thinking about using estrogen or estrogen with progestin treatments, discuss the benefits and risks with your health care provider. What should I know about heart disease and stroke? Heart disease, heart attack, and stroke become more likely as you age. This may be due, in part, to the hormonal changes that your body experiences during menopause. These can affect how your body processes dietary fats, triglycerides, and cholesterol. Heart attack and stroke are both medical emergencies. There are many things that you can do to help prevent heart disease and stroke:  Have your blood pressure checked at least every 1-2 years. High blood pressure causes heart disease and increases the risk of stroke.  If you are 64-38 years old, ask your health care provider if you should take aspirin to prevent a heart attack or a stroke.  Do not use any tobacco products, including cigarettes, chewing tobacco, or electronic cigarettes. If you need help quitting, ask your health care provider.  It is important to eat a healthy diet and maintain a healthy weight. ? Be sure to include plenty of  vegetables, fruits, low-fat dairy products, and lean protein. ? Avoid eating foods that are high in solid fats, added sugars, or salt (sodium).  Get regular exercise. This is one of the most  important things that you can do for your health. ? Try to exercise for at least 150 minutes each week. The type of exercise that you do should increase your heart rate and make you sweat. This is known as moderate-intensity exercise. ? Try to do strengthening exercises at least twice each week. Do these in addition to the moderate-intensity exercise.  Know your numbers.Ask your health care provider to check your cholesterol and your blood glucose. Continue to have your blood tested as directed by your health care provider.  What should I know about cancer screening? There are several types of cancer. Take the following steps to reduce your risk and to catch any cancer development as early as possible. Breast Cancer  Practice breast self-awareness. ? This means understanding how your breasts normally appear and feel. ? It also means doing regular breast self-exams. Let your health care provider know about any changes, no matter how small.  If you are 16 or older, have a clinician do a breast exam (clinical breast exam or CBE) every year. Depending on your age, family history, and medical history, it may be recommended that you also have a yearly breast X-ray (mammogram).  If you have a family history of breast cancer, talk with your health care provider about genetic screening.  If you are at high risk for breast cancer, talk with your health care provider about having an MRI and a mammogram every year.  Breast cancer (BRCA) gene test is recommended for women who have family members with BRCA-related cancers. Results of the assessment will determine the need for genetic counseling and BRCA1 and for BRCA2 testing. BRCA-related cancers include these types: ? Breast. This occurs in males or females. ? Ovarian. ? Tubal. This may also be called fallopian tube cancer. ? Cancer of the abdominal or pelvic lining (peritoneal cancer). ? Prostate. ? Pancreatic.  Cervical, Uterine, and Ovarian  Cancer Your health care provider may recommend that you be screened regularly for cancer of the pelvic organs. These include your ovaries, uterus, and vagina. This screening involves a pelvic exam, which includes checking for microscopic changes to the surface of your cervix (Pap test).  For women ages 21-65, health care providers may recommend a pelvic exam and a Pap test every three years. For women ages 8-65, they may recommend the Pap test and pelvic exam, combined with testing for human papilloma virus (HPV), every five years. Some types of HPV increase your risk of cervical cancer. Testing for HPV may also be done on women of any age who have unclear Pap test results.  Other health care providers may not recommend any screening for nonpregnant women who are considered low risk for pelvic cancer and have no symptoms. Ask your health care provider if a screening pelvic exam is right for you.  If you have had past treatment for cervical cancer or a condition that could lead to cancer, you need Pap tests and screening for cancer for at least 20 years after your treatment. If Pap tests have been discontinued for you, your risk factors (such as having a new sexual partner) need to be reassessed to determine if you should start having screenings again. Some women have medical problems that increase the chance of getting cervical cancer. In these cases, your health care  provider may recommend that you have screening and Pap tests more often.  If you have a family history of uterine cancer or ovarian cancer, talk with your health care provider about genetic screening.  If you have vaginal bleeding after reaching menopause, tell your health care provider.  There are currently no reliable tests available to screen for ovarian cancer.  Lung Cancer Lung cancer screening is recommended for adults 32-61 years old who are at high risk for lung cancer because of a history of smoking. A yearly low-dose CT scan  of the lungs is recommended if you:  Currently smoke.  Have a history of at least 30 pack-years of smoking and you currently smoke or have quit within the past 15 years. A pack-year is smoking an average of one pack of cigarettes per day for one year.  Yearly screening should:  Continue until it has been 15 years since you quit.  Stop if you develop a health problem that would prevent you from having lung cancer treatment.  Colorectal Cancer  This type of cancer can be detected and can often be prevented.  Routine colorectal cancer screening usually begins at age 1 and continues through age 69.  If you have risk factors for colon cancer, your health care provider may recommend that you be screened at an earlier age.  If you have a family history of colorectal cancer, talk with your health care provider about genetic screening.  Your health care provider may also recommend using home test kits to check for hidden blood in your stool.  A small camera at the end of a tube can be used to examine your colon directly (sigmoidoscopy or colonoscopy). This is done to check for the earliest forms of colorectal cancer.  Direct examination of the colon should be repeated every 5-10 years until age 55. However, if early forms of precancerous polyps or small growths are found or if you have a family history or genetic risk for colorectal cancer, you may need to be screened more often.  Skin Cancer  Check your skin from head to toe regularly.  Monitor any moles. Be sure to tell your health care provider: ? About any new moles or changes in moles, especially if there is a change in a mole's shape or color. ? If you have a mole that is larger than the size of a pencil eraser.  If any of your family members has a history of skin cancer, especially at a young age, talk with your health care provider about genetic screening.  Always use sunscreen. Apply sunscreen liberally and repeatedly  throughout the day.  Whenever you are outside, protect yourself by wearing long sleeves, pants, a wide-brimmed hat, and sunglasses.  What should I know about osteoporosis? Osteoporosis is a condition in which bone destruction happens more quickly than new bone creation. After menopause, you may be at an increased risk for osteoporosis. To help prevent osteoporosis or the bone fractures that can happen because of osteoporosis, the following is recommended:  If you are 24-70 years old, get at least 1,000 mg of calcium and at least 600 mg of vitamin D per day.  If you are older than age 42 but younger than age 70, get at least 1,200 mg of calcium and at least 600 mg of vitamin D per day.  If you are older than age 42, get at least 1,200 mg of calcium and at least 800 mg of vitamin D per day.  Smoking and  excessive alcohol intake increase the risk of osteoporosis. Eat foods that are rich in calcium and vitamin D, and do weight-bearing exercises several times each week as directed by your health care provider. What should I know about how menopause affects my mental health? Depression may occur at any age, but it is more common as you become older. Common symptoms of depression include:  Low or sad mood.  Changes in sleep patterns.  Changes in appetite or eating patterns.  Feeling an overall lack of motivation or enjoyment of activities that you previously enjoyed.  Frequent crying spells.  Talk with your health care provider if you think that you are experiencing depression. What should I know about immunizations? It is important that you get and maintain your immunizations. These include:  Tetanus, diphtheria, and pertussis (Tdap) booster vaccine.  Influenza every year before the flu season begins.  Pneumonia vaccine.  Shingles vaccine.  Your health care provider may also recommend other immunizations. This information is not intended to replace advice given to you by your health  care provider. Make sure you discuss any questions you have with your health care provider. Document Released: 09/13/2005 Document Revised: 02/09/2016 Document Reviewed: 04/25/2015 Elsevier Interactive Patient Education  2018 [employer].

## 2017-08-14 NOTE — Progress Notes (Signed)
Jamie Espinoza May 25, 1951 [id number]   History:    67 y.o. G3P3L3 Divorced.  Boyfriend in [place].  Retired x 1 year.  Patient has 7 grand-children.  RP:  Established patient presenting for Annual/Gyn exam  HPI: Menopause, well on no hormone replacement therapy.  Only uses Vagifem and testosterone cream a few weeks prior to visiting with boyfriend, when she is sexually active.  No postmenopausal bleeding.  No pelvic pain.  Normal vaginal secretions.  Has pelvic floor relaxation with uterine prolapse and cystocele which are not symptomatic.  No stress urinary incontinence.  Normal bowel movements.  Breasts normal.  Screening mammogram done this morning.  Health labs done with family physician.  Past medical history,surgical history, family history and social history were all reviewed and documented in the EPIC chart.  Gynecologic History No LMP recorded. Patient is postmenopausal. Contraception: post menopausal status Last Pap: 04/2015. Results were: Neg/HPV HR neg Last mammogram: 08/14/2017.  Results were: Pending Bone Density 2015, will schedule at [facility] 2017  Obstetric History OB History  Gravida Para Term Preterm AB Living  3 3       3   SAB TAB Ectopic Multiple Live Births               # Outcome Date GA Lbr Len/2nd Weight Sex Delivery Anes PTL Lv  3 Para           2 Para           1 Para                ROS: A ROS was performed and pertinent positives and negatives are included in the history.  GENERAL: No fevers or chills. HEENT: No change in vision, no earache, sore throat or sinus congestion. NECK: No pain or stiffness. CARDIOVASCULAR: No chest pain or pressure. No palpitations. PULMONARY: No shortness of breath, cough or wheeze. GASTROINTESTINAL: No abdominal pain, nausea, vomiting or diarrhea, melena or bright red blood per rectum. GENITOURINARY: No urinary frequency, urgency, hesitancy or dysuria. MUSCULOSKELETAL: No joint or muscle pain, no back  pain, no recent trauma. DERMATOLOGIC: No rash, no itching, no lesions. ENDOCRINE: No polyuria, polydipsia, no heat or cold intolerance. No recent change in weight. HEMATOLOGICAL: No anemia or easy bruising or bleeding. NEUROLOGIC: No headache, seizures, numbness, tingling or weakness. PSYCHIATRIC: No depression, no loss of interest in normal activity or change in sleep pattern.     Exam:   BP 132/86   Ht 5\' 2"  (1.575 m)   Wt 212 lb 9.6 oz (96.4 kg)   BMI 38.89 kg/m   Body mass index is 38.89 kg/m.  General appearance : Well developed well nourished female. No acute distress HEENT: Eyes: no retinal hemorrhage or exudates,  Neck supple, trachea midline, no carotid bruits, no thyroidmegaly Lungs: Clear to auscultation, no rhonchi or wheezes, or rib retractions  Heart: Regular rate and rhythm, no murmurs or gallops Breast:Examined in sitting and supine position were symmetrical in appearance, no palpable masses or tenderness,  no skin retraction, no nipple inversion, no nipple discharge, no skin discoloration, no axillary or supraclavicular lymphadenopathy Abdomen: no palpable masses or tenderness, no rebound or guarding Extremities: no edema or skin discoloration or tenderness  Pelvic: Vulva normal  Bartholin, Urethra, Skene Glands: Within normal limits             Vagina: No gross lesions or discharge.  Relaxation of pelvic floor.  Cervix: No gross lesions or discharge.  Pap  reflex done.  Uterus  AV, normal size, shape and consistency, non-tender and mobile  Adnexa  Without masses or tenderness  Anus and perineum  normal   Assessment/Plan:  67 y.o. female for annual exam   1. Encounter for gynecological examination with abnormal finding Gynecologic exam with relaxation of pelvic floor, a symptomatic uterine prolapse and cystocele.  Pap reflex done.  Breast exam normal.  Screening mammogram done today, pending results.  Will schedule bone density at [facility].  Colonoscopy  2017.  Health labs with family physician.  2. Menopause present Well on no hormone replacement therapy.  No postmenopausal bleeding.  3. Post-menopausal atrophic vaginitis Will restart on Vagifem and testosterone cream prior to resuming sexual activity in a few weeks.  Prescription reordered for both.  4. Screening for osteoporosis Vitamin D supplements, calcium rich nutrition and regular weightbearing physical activity recommended. - DG Bone Density; Future  5. Screening for malignant neoplasm of cervix - Pap IG w/ reflex to HPV when ASC-U  Other orders - Estradiol 10 MCG TABS vaginal tablet; Place 1 tablet (10 mcg total) vaginally 2 (two) times a week. - Testosterone 0.2% cream apply 1/2 to 1 mL topically once a day called to [facility].  Princess Bruins MD, 2:15 PM 08/14/2017

## 2017-08-15 ENCOUNTER — Telehealth: Payer: Self-pay | Admitting: *Deleted

## 2017-08-15 ENCOUNTER — Other Ambulatory Visit: Payer: Self-pay | Admitting: Family Medicine

## 2017-08-15 DIAGNOSIS — R928 Other abnormal and inconclusive findings on diagnostic imaging of breast: Secondary | ICD-10-CM

## 2017-08-15 LAB — PAP IG W/ RFLX HPV ASCU

## 2017-08-15 MED ORDER — NONFORMULARY OR COMPOUNDED ITEM: 3 refills | Status: DC

## 2017-08-15 NOTE — Telephone Encounter (Signed)
Rx called in 

## 2017-08-15 NOTE — Telephone Encounter (Signed)
-----   Message from Princess Bruins, MD sent at 08/14/2017  4:09 PM EST ----- Regarding: Please call in Testosterone cream at [facility] Testosterone 0.02% cream 1/2 to 1 mL topically once a day.  3 month supply, refill x 3.  At [facility]

## 2017-08-18 ENCOUNTER — Telehealth: Payer: Self-pay | Admitting: *Deleted

## 2017-08-18 MED ORDER — ESTROGENS, CONJUGATED 0.625 MG/GM VA CREA: TOPICAL_CREAM | VAGINAL | 10 refills | Status: DC

## 2017-08-18 NOTE — Telephone Encounter (Signed)
Please prescribe Premarin cream 1/4 of an applicator twice a week with a thin layer on vulva twice a week.

## 2017-08-18 NOTE — Telephone Encounter (Signed)
Pt aware, Rx sent. 

## 2017-08-18 NOTE — Telephone Encounter (Signed)
Patient called stating vagifem 10 mcg tablets are $417, too expensive for her, asked if another estrogen medication can be prescribed?  Please advise

## 2017-08-20 ENCOUNTER — Ambulatory Visit
Admission: RE | Admit: 2017-08-20 | Discharge: 2017-08-20 | Disposition: A | Discharge status: Home / Self Care | Payer: Medicare Other | Source: Ambulatory Visit | Attending: Family Medicine | Admitting: Family Medicine

## 2017-08-20 ENCOUNTER — Other Ambulatory Visit: Payer: Self-pay | Admitting: Family Medicine

## 2017-08-20 DIAGNOSIS — R928 Other abnormal and inconclusive findings on diagnostic imaging of breast: Secondary | ICD-10-CM

## 2017-08-20 DIAGNOSIS — N641 Fat necrosis of breast: Secondary | ICD-10-CM

## 2017-08-20 DIAGNOSIS — N6489 Other specified disorders of breast: Secondary | ICD-10-CM | POA: Diagnosis not present

## 2017-09-29 ENCOUNTER — Other Ambulatory Visit: Payer: Self-pay | Admitting: Gynecology

## 2017-09-29 DIAGNOSIS — M65342 Trigger finger, left ring finger: Secondary | ICD-10-CM | POA: Diagnosis not present

## 2017-09-29 DIAGNOSIS — M65331 Trigger finger, right middle finger: Secondary | ICD-10-CM | POA: Diagnosis not present

## 2017-09-29 DIAGNOSIS — M79641 Pain in right hand: Secondary | ICD-10-CM | POA: Diagnosis not present

## 2017-09-29 DIAGNOSIS — M65839 Other synovitis and tenosynovitis, unspecified forearm: Secondary | ICD-10-CM | POA: Diagnosis not present

## 2017-09-29 DIAGNOSIS — Z780 Asymptomatic menopausal state: Secondary | ICD-10-CM

## 2017-09-29 DIAGNOSIS — M79645 Pain in left finger(s): Secondary | ICD-10-CM | POA: Diagnosis not present

## 2017-10-06 DIAGNOSIS — J22 Unspecified acute lower respiratory infection: Secondary | ICD-10-CM | POA: Diagnosis not present

## 2017-10-06 DIAGNOSIS — Z20828 Contact with and (suspected) exposure to other viral communicable diseases: Secondary | ICD-10-CM | POA: Diagnosis not present

## 2017-10-07 DIAGNOSIS — J209 Acute bronchitis, unspecified: Secondary | ICD-10-CM | POA: Diagnosis not present

## 2017-10-07 DIAGNOSIS — Z20828 Contact with and (suspected) exposure to other viral communicable diseases: Secondary | ICD-10-CM | POA: Diagnosis not present

## 2017-10-13 ENCOUNTER — Ambulatory Visit (INDEPENDENT_AMBULATORY_CARE_PROVIDER_SITE_OTHER): Payer: Medicare Other

## 2017-10-13 DIAGNOSIS — Z780 Asymptomatic menopausal state: Secondary | ICD-10-CM

## 2017-10-14 ENCOUNTER — Encounter: Payer: Self-pay | Admitting: Gynecology

## 2017-10-17 DIAGNOSIS — M65331 Trigger finger, right middle finger: Secondary | ICD-10-CM | POA: Diagnosis not present

## 2017-10-17 DIAGNOSIS — M65342 Trigger finger, left ring finger: Secondary | ICD-10-CM | POA: Diagnosis not present

## 2017-11-26 DIAGNOSIS — J309 Allergic rhinitis, unspecified: Secondary | ICD-10-CM | POA: Diagnosis not present

## 2017-12-31 DIAGNOSIS — M79641 Pain in right hand: Secondary | ICD-10-CM | POA: Diagnosis not present

## 2017-12-31 DIAGNOSIS — M65342 Trigger finger, left ring finger: Secondary | ICD-10-CM | POA: Diagnosis not present

## 2017-12-31 DIAGNOSIS — M65331 Trigger finger, right middle finger: Secondary | ICD-10-CM | POA: Diagnosis not present

## 2017-12-31 DIAGNOSIS — M79645 Pain in left finger(s): Secondary | ICD-10-CM | POA: Diagnosis not present

## 2018-02-03 DIAGNOSIS — E559 Vitamin D deficiency, unspecified: Secondary | ICD-10-CM | POA: Diagnosis not present

## 2018-02-03 DIAGNOSIS — F419 Anxiety disorder, unspecified: Secondary | ICD-10-CM | POA: Diagnosis not present

## 2018-02-03 DIAGNOSIS — M8588 Other specified disorders of bone density and structure, other site: Secondary | ICD-10-CM | POA: Diagnosis not present

## 2018-02-03 DIAGNOSIS — E7800 Pure hypercholesterolemia, unspecified: Secondary | ICD-10-CM | POA: Diagnosis not present

## 2018-02-03 DIAGNOSIS — I10 Essential (primary) hypertension: Secondary | ICD-10-CM | POA: Diagnosis not present

## 2018-02-03 DIAGNOSIS — Z79899 Other long term (current) drug therapy: Secondary | ICD-10-CM | POA: Diagnosis not present

## 2018-02-03 DIAGNOSIS — M159 Polyosteoarthritis, unspecified: Secondary | ICD-10-CM | POA: Diagnosis not present

## 2018-02-03 DIAGNOSIS — G4733 Obstructive sleep apnea (adult) (pediatric): Secondary | ICD-10-CM | POA: Diagnosis not present

## 2018-02-03 DIAGNOSIS — F3342 Major depressive disorder, recurrent, in full remission: Secondary | ICD-10-CM | POA: Diagnosis not present

## 2018-02-03 DIAGNOSIS — E039 Hypothyroidism, unspecified: Secondary | ICD-10-CM | POA: Diagnosis not present

## 2018-02-03 DIAGNOSIS — K219 Gastro-esophageal reflux disease without esophagitis: Secondary | ICD-10-CM | POA: Diagnosis not present

## 2018-02-03 DIAGNOSIS — R7301 Impaired fasting glucose: Secondary | ICD-10-CM | POA: Diagnosis not present

## 2018-02-09 DIAGNOSIS — M65331 Trigger finger, right middle finger: Secondary | ICD-10-CM | POA: Diagnosis not present

## 2018-02-09 DIAGNOSIS — M79641 Pain in right hand: Secondary | ICD-10-CM | POA: Diagnosis not present

## 2018-02-09 DIAGNOSIS — M65342 Trigger finger, left ring finger: Secondary | ICD-10-CM | POA: Diagnosis not present

## 2018-02-09 DIAGNOSIS — G4733 Obstructive sleep apnea (adult) (pediatric): Secondary | ICD-10-CM | POA: Diagnosis not present

## 2018-02-24 ENCOUNTER — Other Ambulatory Visit: Payer: Medicare Other

## 2018-03-16 ENCOUNTER — Ambulatory Visit
Admission: RE | Admit: 2018-03-16 | Discharge: 2018-03-16 | Disposition: A | Discharge status: Home / Self Care | Payer: Medicare Other | Source: Ambulatory Visit | Attending: Family Medicine | Admitting: Family Medicine

## 2018-03-16 DIAGNOSIS — N641 Fat necrosis of breast: Secondary | ICD-10-CM

## 2018-03-16 DIAGNOSIS — N6489 Other specified disorders of breast: Secondary | ICD-10-CM | POA: Diagnosis not present

## 2018-05-20 ENCOUNTER — Other Ambulatory Visit: Payer: Self-pay

## 2018-05-20 MED ORDER — NONFORMULARY OR COMPOUNDED ITEM: 3 refills | Status: DC

## 2018-05-20 MED ORDER — NONFORMULARY OR COMPOUNDED ITEM: 3 refills | Status: AC

## 2018-05-20 NOTE — Addendum Note (Signed)
Addended by: Ramond Craver on: 05/20/2018 10:54 AM   Modules accepted: Orders

## 2018-05-21 DIAGNOSIS — J019 Acute sinusitis, unspecified: Secondary | ICD-10-CM | POA: Diagnosis not present

## 2018-06-08 DIAGNOSIS — M65331 Trigger finger, right middle finger: Secondary | ICD-10-CM | POA: Diagnosis not present

## 2018-06-08 DIAGNOSIS — M65342 Trigger finger, left ring finger: Secondary | ICD-10-CM | POA: Diagnosis not present

## 2018-06-08 DIAGNOSIS — M65332 Trigger finger, left middle finger: Secondary | ICD-10-CM | POA: Diagnosis not present

## 2018-06-08 DIAGNOSIS — M79645 Pain in left finger(s): Secondary | ICD-10-CM | POA: Diagnosis not present

## 2018-06-11 DIAGNOSIS — Z23 Encounter for immunization: Secondary | ICD-10-CM | POA: Diagnosis not present

## 2018-07-13 DIAGNOSIS — R7301 Impaired fasting glucose: Secondary | ICD-10-CM | POA: Diagnosis not present

## 2018-07-13 DIAGNOSIS — F419 Anxiety disorder, unspecified: Secondary | ICD-10-CM | POA: Diagnosis not present

## 2018-07-13 DIAGNOSIS — Z79899 Other long term (current) drug therapy: Secondary | ICD-10-CM | POA: Diagnosis not present

## 2018-07-13 DIAGNOSIS — H2513 Age-related nuclear cataract, bilateral: Secondary | ICD-10-CM | POA: Diagnosis not present

## 2018-07-13 DIAGNOSIS — E559 Vitamin D deficiency, unspecified: Secondary | ICD-10-CM | POA: Diagnosis not present

## 2018-07-13 DIAGNOSIS — M65342 Trigger finger, left ring finger: Secondary | ICD-10-CM | POA: Diagnosis not present

## 2018-07-13 DIAGNOSIS — G4733 Obstructive sleep apnea (adult) (pediatric): Secondary | ICD-10-CM | POA: Diagnosis not present

## 2018-07-13 DIAGNOSIS — I10 Essential (primary) hypertension: Secondary | ICD-10-CM | POA: Diagnosis not present

## 2018-07-13 DIAGNOSIS — H524 Presbyopia: Secondary | ICD-10-CM | POA: Diagnosis not present

## 2018-07-13 DIAGNOSIS — F3342 Major depressive disorder, recurrent, in full remission: Secondary | ICD-10-CM | POA: Diagnosis not present

## 2018-07-13 DIAGNOSIS — M8588 Other specified disorders of bone density and structure, other site: Secondary | ICD-10-CM | POA: Diagnosis not present

## 2018-07-13 DIAGNOSIS — K219 Gastro-esophageal reflux disease without esophagitis: Secondary | ICD-10-CM | POA: Diagnosis not present

## 2018-07-13 DIAGNOSIS — M25521 Pain in right elbow: Secondary | ICD-10-CM | POA: Diagnosis not present

## 2018-07-13 DIAGNOSIS — E039 Hypothyroidism, unspecified: Secondary | ICD-10-CM | POA: Diagnosis not present

## 2018-07-13 DIAGNOSIS — E7800 Pure hypercholesterolemia, unspecified: Secondary | ICD-10-CM | POA: Diagnosis not present

## 2018-07-13 DIAGNOSIS — M159 Polyosteoarthritis, unspecified: Secondary | ICD-10-CM | POA: Diagnosis not present

## 2018-07-16 DIAGNOSIS — F339 Major depressive disorder, recurrent, unspecified: Secondary | ICD-10-CM | POA: Diagnosis not present

## 2018-07-16 DIAGNOSIS — R7301 Impaired fasting glucose: Secondary | ICD-10-CM | POA: Diagnosis not present

## 2018-07-16 DIAGNOSIS — I10 Essential (primary) hypertension: Secondary | ICD-10-CM | POA: Diagnosis not present

## 2018-07-16 DIAGNOSIS — Z23 Encounter for immunization: Secondary | ICD-10-CM | POA: Diagnosis not present

## 2018-07-16 DIAGNOSIS — E039 Hypothyroidism, unspecified: Secondary | ICD-10-CM | POA: Diagnosis not present

## 2018-07-16 DIAGNOSIS — K219 Gastro-esophageal reflux disease without esophagitis: Secondary | ICD-10-CM | POA: Diagnosis not present

## 2018-07-16 DIAGNOSIS — E559 Vitamin D deficiency, unspecified: Secondary | ICD-10-CM | POA: Diagnosis not present

## 2018-07-16 DIAGNOSIS — R7303 Prediabetes: Secondary | ICD-10-CM | POA: Diagnosis not present

## 2018-07-16 DIAGNOSIS — Z0000 Encounter for general adult medical examination without abnormal findings: Secondary | ICD-10-CM | POA: Diagnosis not present

## 2018-07-16 DIAGNOSIS — F419 Anxiety disorder, unspecified: Secondary | ICD-10-CM | POA: Diagnosis not present

## 2018-07-16 DIAGNOSIS — Z1159 Encounter for screening for other viral diseases: Secondary | ICD-10-CM | POA: Diagnosis not present

## 2018-07-16 DIAGNOSIS — E7800 Pure hypercholesterolemia, unspecified: Secondary | ICD-10-CM | POA: Diagnosis not present

## 2018-07-17 DIAGNOSIS — S62617A Displaced fracture of proximal phalanx of left little finger, initial encounter for closed fracture: Secondary | ICD-10-CM | POA: Diagnosis not present

## 2018-07-20 DIAGNOSIS — S62617D Displaced fracture of proximal phalanx of left little finger, subsequent encounter for fracture with routine healing: Secondary | ICD-10-CM | POA: Diagnosis not present

## 2018-07-21 ENCOUNTER — Encounter: Payer: Self-pay | Admitting: Women's Health

## 2018-07-21 ENCOUNTER — Ambulatory Visit (INDEPENDENT_AMBULATORY_CARE_PROVIDER_SITE_OTHER): Payer: Medicare Other | Admitting: Women's Health

## 2018-07-21 VITALS — BP 126/80

## 2018-07-21 DIAGNOSIS — N898 Other specified noninflammatory disorders of vagina: Secondary | ICD-10-CM

## 2018-07-21 LAB — WET PREP FOR TRICH, YEAST, CLUE

## 2018-07-21 NOTE — Patient Instructions (Signed)

## 2018-07-21 NOTE — Progress Notes (Signed)
67 year old [place] presents with complaint of vaginal  discharge with no odor or itching for the past week.  Discharge is white with slight stickiness to it.  Is menopausal with no itching on Premarin vaginal cream twice weekly.  Rare sexual activity same partner for years.  Has had recent increased stress due to move.  Medical  problems include hypertension, hypothyroidism, anxiety/depression.  Exam: Appears well.  Obese.  External genitalia +3 cystocele, speculum exam no visible discharge or erythema, wet prep negative.  Bimanual no CMT or tenderness with exam.  Normal discharge +3 cystocele  Plan: Reassurance given regarding normality of wet prep, states does have occasional vaginal pressure from cystocele but otherwise asymptomatic.  Has annual exam scheduled in January with Dr. Dellis Filbert.

## 2018-07-22 ENCOUNTER — Encounter: Payer: Self-pay | Admitting: Anesthesiology

## 2018-08-07 DIAGNOSIS — S62617D Displaced fracture of proximal phalanx of left little finger, subsequent encounter for fracture with routine healing: Secondary | ICD-10-CM | POA: Diagnosis not present

## 2018-08-17 ENCOUNTER — Ambulatory Visit (INDEPENDENT_AMBULATORY_CARE_PROVIDER_SITE_OTHER): Payer: Medicare Other | Admitting: Obstetrics & Gynecology

## 2018-08-17 ENCOUNTER — Encounter: Payer: Self-pay | Admitting: Obstetrics & Gynecology

## 2018-08-17 VITALS — BP 136/88 | Ht 62.0 in | Wt 208.0 lb

## 2018-08-17 DIAGNOSIS — Z01419 Encounter for gynecological examination (general) (routine) without abnormal findings: Secondary | ICD-10-CM | POA: Diagnosis not present

## 2018-08-17 DIAGNOSIS — Z780 Asymptomatic menopausal state: Secondary | ICD-10-CM

## 2018-08-17 DIAGNOSIS — Z6838 Body mass index (BMI) 38.0-38.9, adult: Secondary | ICD-10-CM

## 2018-08-17 DIAGNOSIS — E6609 Other obesity due to excess calories: Secondary | ICD-10-CM

## 2018-08-17 DIAGNOSIS — M8588 Other specified disorders of bone density and structure, other site: Secondary | ICD-10-CM

## 2018-08-17 NOTE — Patient Instructions (Signed)
1. Well female exam with routine gynecological exam Normal gynecologic exam and menopause, except for asymptomatic cystocele grade 3/4.  Pap test January 2019 was negative.  No indication to repeat this year.  Breast exam normal.  Will schedule screening mammogram January 2020.  Health labs with family physician.  Colonoscopy 3 to 4 years ago.  2. Postmenopause Well on no hormone replacement therapy.  No postmenopausal bleeding.  3. Osteopenia of spine Bone density December 2019 showed osteopenia pia with a T score of -1.1.  Continue vitamin D supplements, calcium intake of 1500 mg daily and regular weightbearing physical activities.  4. Class 2 obesity due to excess calories without serious comorbidity with body mass index (BMI) of 38.0 to 38.9 in adult Recommend lower calorie/carb diet such as [employer] and aerobic physical activity 5 times a week with weightlifting every 2 days.  Jamie Espinoza, it was a pleasure seeing you today!  I wish you the best in your new location in [place].  I will be here for you if you need me!

## 2018-08-17 NOTE — Progress Notes (Signed)
Jamie Espinoza November 06, 1950 [id number]   History:    68 y.o. . G3P3L3 Divorced.  Retired x 2 year.  Patient has 7 grand-children.  RP:  Established patient presenting for Annual/Gyn exam  HPI: Menopause, well on no hormone replacement therapy.  No postmenopausal bleeding.  No pelvic pain.  Normal vaginal secretions. Has pelvic floor relaxation with uterine prolapse and cystocele which are not symptomatic.  No stress urinary incontinence.  No leakage of urine. Normal bowel movements. Breasts normal.  BMI 38.04.  Walks.  Health labs done with family physician.   Past medical history,surgical history, family history and social history were all reviewed and documented in the EPIC chart.  Gynecologic History No LMP recorded. Patient is postmenopausal. Contraception: post menopausal status Last Pap: 08/2017. Results were: Negative Last mammogram: 08/2017. Results were: Negative Bone Density: 07/2018 Osteopenia T-Score -1.1 Colonoscopy: 3-4 yrs ago  Obstetric History OB History  Gravida Para Term Preterm AB Living  3 3       3   SAB TAB Ectopic Multiple Live Births               # Outcome Date GA Lbr Len/2nd Weight Sex Delivery Anes PTL Lv  3 Para           2 Para           1 Para              ROS: A ROS was performed and pertinent positives and negatives are included in the history.  GENERAL: No fevers or chills. HEENT: No change in vision, no earache, sore throat or sinus congestion. NECK: No pain or stiffness. CARDIOVASCULAR: No chest pain or pressure. No palpitations. PULMONARY: No shortness of breath, cough or wheeze. GASTROINTESTINAL: No abdominal pain, nausea, vomiting or diarrhea, melena or bright red blood per rectum. GENITOURINARY: No urinary frequency, urgency, hesitancy or dysuria. MUSCULOSKELETAL: No joint or muscle pain, no back pain, no recent trauma. DERMATOLOGIC: No rash, no itching, no lesions. ENDOCRINE: No polyuria, polydipsia, no heat or cold intolerance. No recent  change in weight. HEMATOLOGICAL: No anemia or easy bruising or bleeding. NEUROLOGIC: No headache, seizures, numbness, tingling or weakness. PSYCHIATRIC: No depression, no loss of interest in normal activity or change in sleep pattern.     Exam:   BP 136/88   Ht 5\' 2"  (1.575 m)   Wt 208 lb (94.3 kg)   BMI 38.04 kg/m   Body mass index is 38.04 kg/m.  General appearance : Well developed well nourished female. No acute distress HEENT: Eyes: no retinal hemorrhage or exudates,  Neck supple, trachea midline, no carotid bruits, no thyroidmegaly Lungs: Clear to auscultation, no rhonchi or wheezes, or rib retractions  Heart: Regular rate and rhythm, no murmurs or gallops Breast:Examined in sitting and supine position were symmetrical in appearance, no palpable masses or tenderness,  no skin retraction, no nipple inversion, no nipple discharge, no skin discoloration, no axillary or supraclavicular lymphadenopathy Abdomen: no palpable masses or tenderness, no rebound or guarding Extremities: no edema or skin discoloration or tenderness  Pelvic: Vulva: Normal             Vagina: No gross lesions or discharge.  Cystocele grade 3/4.  Cervix: No gross lesions or discharge  Uterus  AV, normal size, shape and consistency, non-tender and mobile  Adnexa  Without masses or tenderness  Anus: Normal   Assessment/Plan:  68 y.o. female for annual exam   1. Well female exam with routine  gynecological exam Normal gynecologic exam and menopause, except for asymptomatic cystocele grade 3/4.  Pap test January 2019 was negative.  No indication to repeat this year.  Breast exam normal.  Will schedule screening mammogram January 2020.  Health labs with family physician.  Colonoscopy 3 to 4 years ago.  2. Postmenopause Well on no hormone replacement therapy.  No postmenopausal bleeding.  3. Osteopenia of spine Bone density December 2019 showed osteopenia pia with a T score of -1.1.  Continue vitamin D  supplements, calcium intake of 1500 mg daily and regular weightbearing physical activities.  4. Class 2 obesity due to excess calories without serious comorbidity with body mass index (BMI) of 38.0 to 38.9 in adult Recommend lower calorie/carb diet such as [employer] and aerobic physical activity 5 times a week with weightlifting every 2 days.  Princess Bruins MD, 2:21 PM 08/17/2018

## 2018-08-31 DIAGNOSIS — J0100 Acute maxillary sinusitis, unspecified: Secondary | ICD-10-CM | POA: Diagnosis not present

## 2018-10-01 DIAGNOSIS — J0191 Acute recurrent sinusitis, unspecified: Secondary | ICD-10-CM | POA: Diagnosis not present

## 2018-10-01 DIAGNOSIS — R05 Cough: Secondary | ICD-10-CM | POA: Diagnosis not present

## 2018-10-15 DIAGNOSIS — B351 Tinea unguium: Secondary | ICD-10-CM | POA: Diagnosis not present

## 2018-10-16 DIAGNOSIS — Z1231 Encounter for screening mammogram for malignant neoplasm of breast: Secondary | ICD-10-CM | POA: Diagnosis not present

## 2018-11-18 IMAGING — MG 2D DIGITAL DIAGNOSTIC UNILATERAL LEFT MAMMOGRAM WITH CAD AND ADJ
6 series · 6 of 14 positions shown · non-contrast
Comparison: Previous exam(s).

CLINICAL DATA: Patient was called back from screening mammogram for
a possible asymmetry in the left breast.

EXAM:
2D DIGITAL DIAGNOSTIC LEFT MAMMOGRAM WITH ADJUNCT TOMO
ULTRASOUND LEFT BREAST

[L CC]
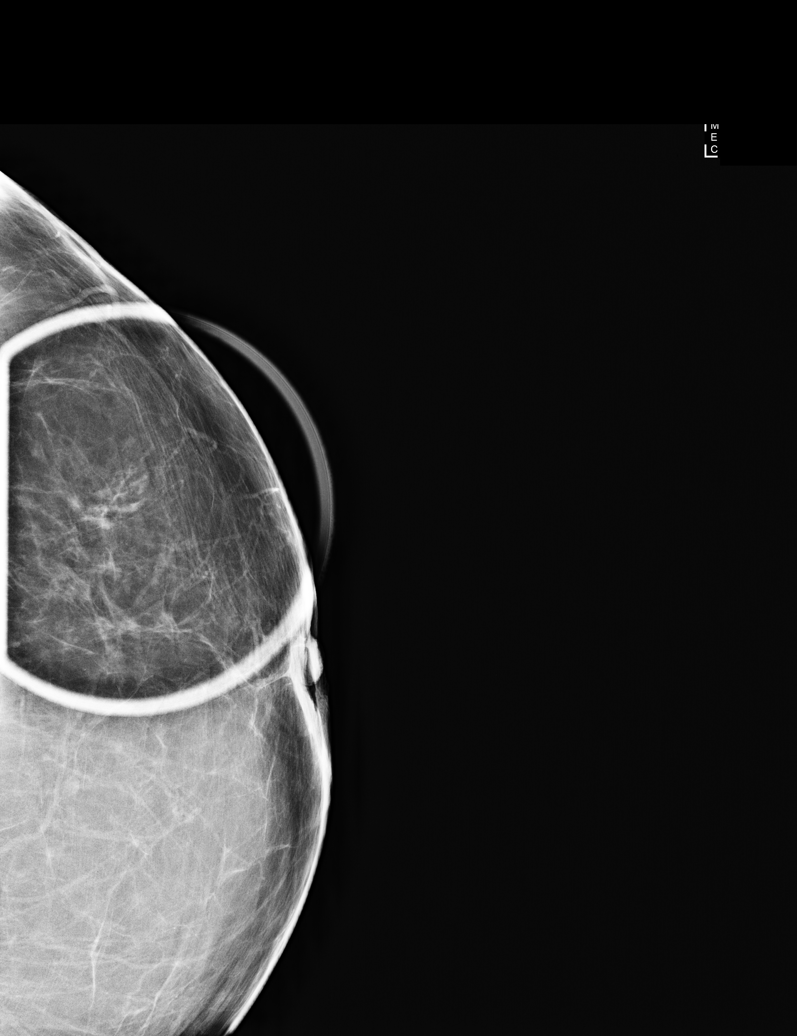

[L CC synth-2D]
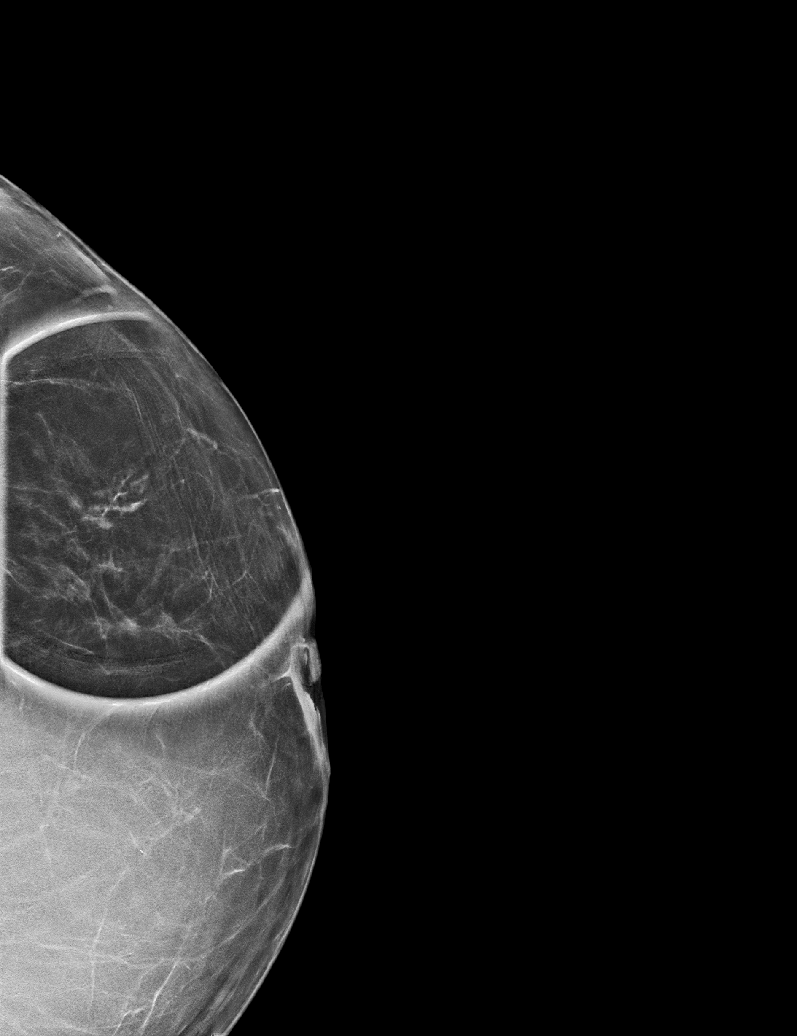

[L MLO]
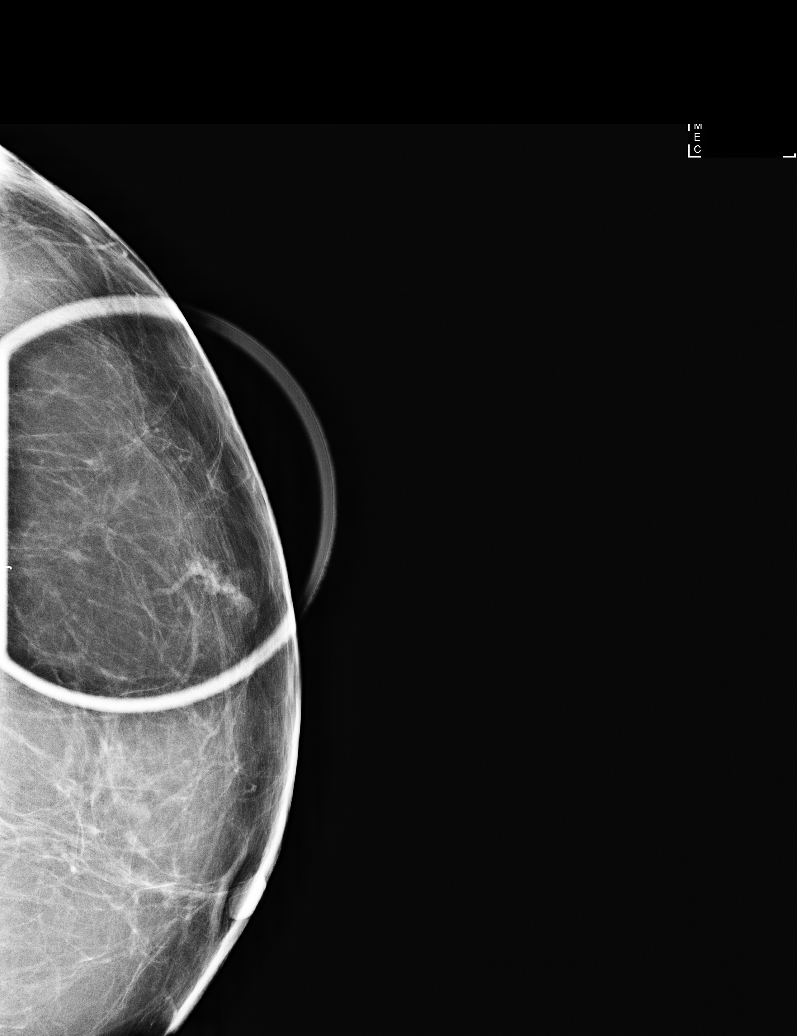

[L MLO synth-2D]
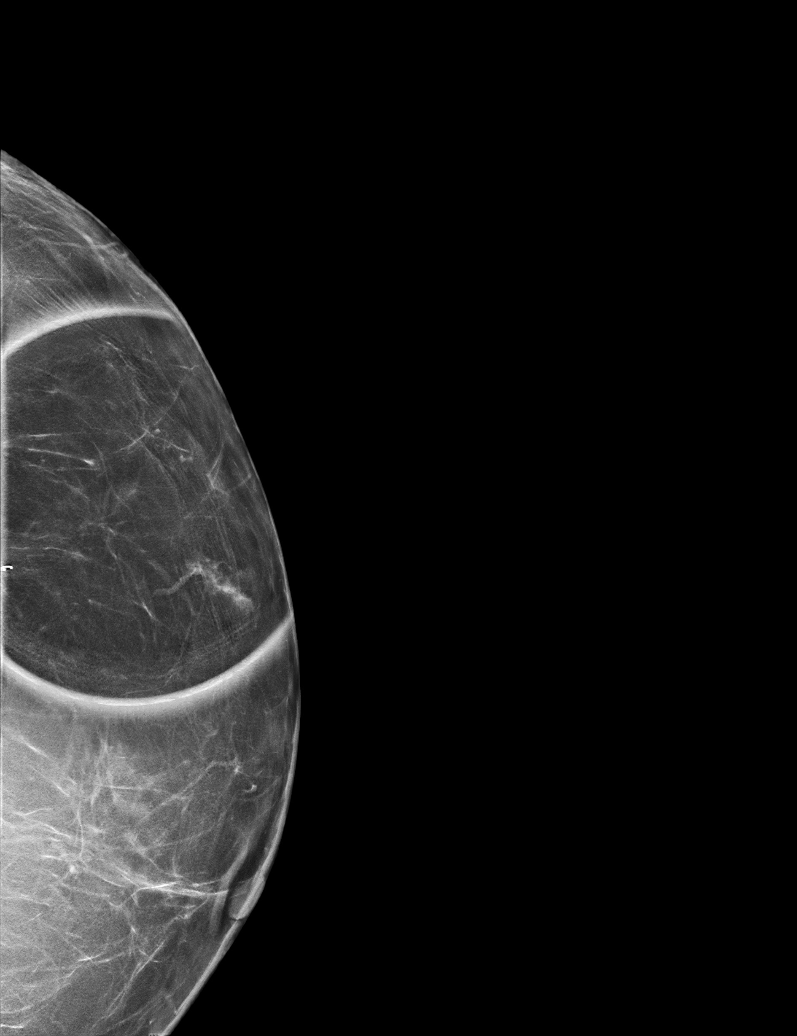

[L MLO tomo · tomo slice 31/61.0]
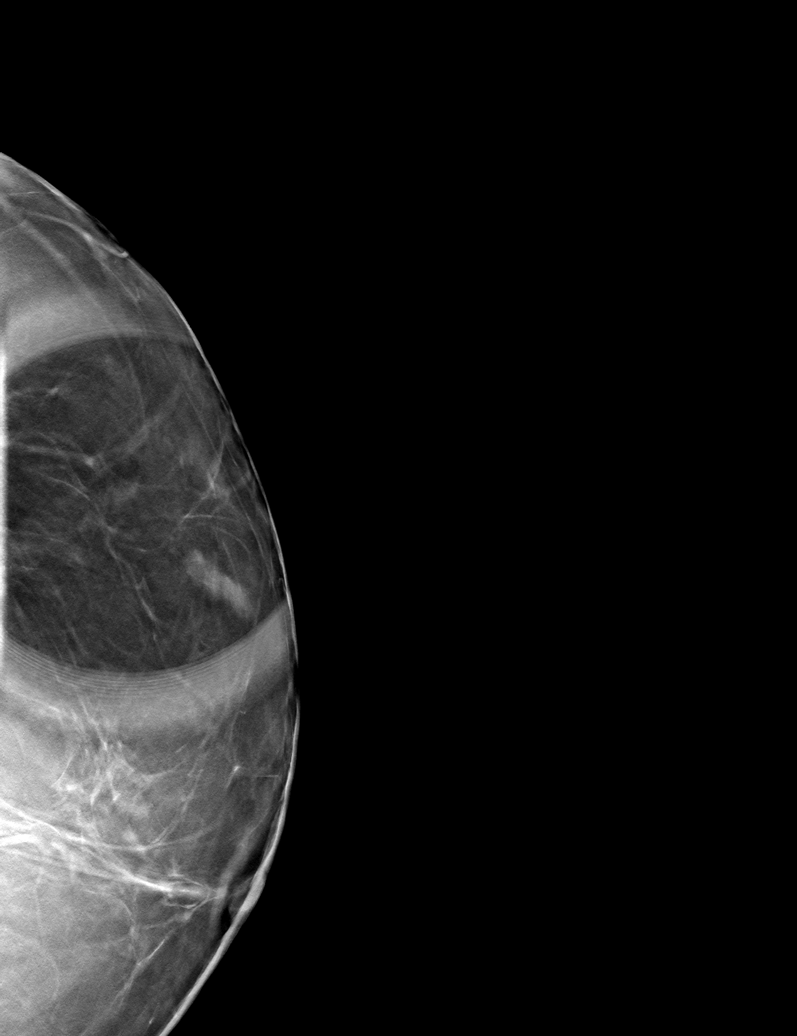

[L CC tomo · tomo slice 28/55.0]
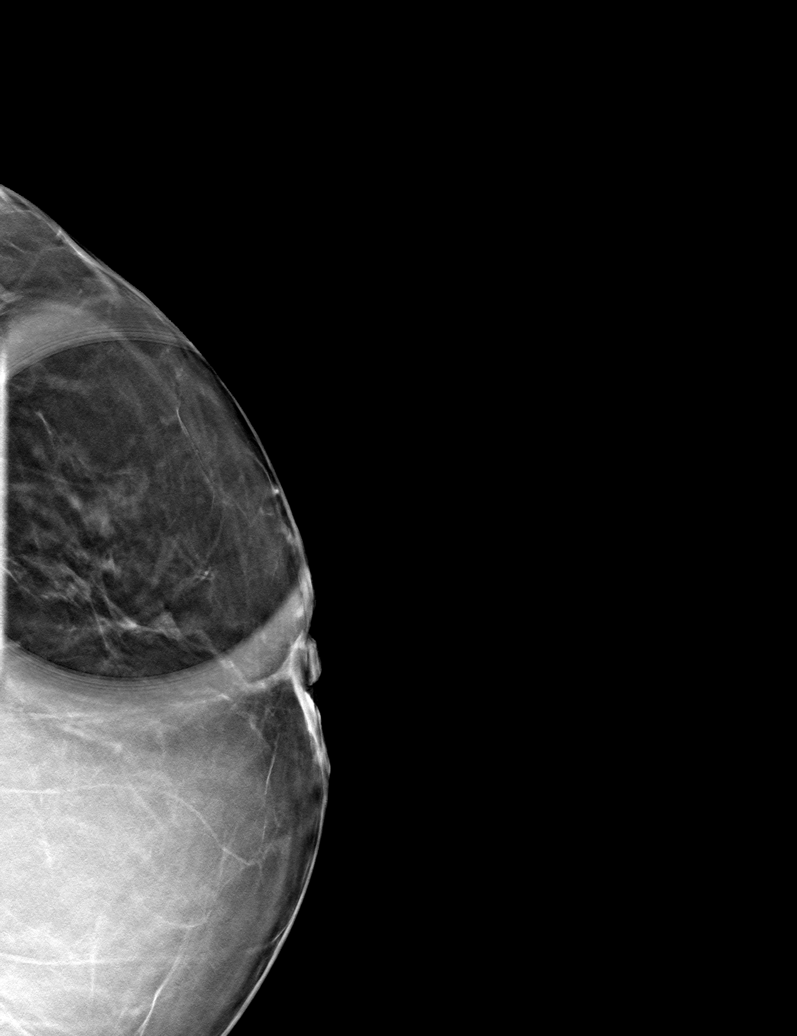

[6 of 14 positions shown; findings below may reference images not displayed]

ACR Breast Density Category b: There are scattered areas of
fibroglandular density.
FINDINGS: Additional imaging of the left breast was performed. There is
persistence a superficial focal asymmetry in the upper-outer
quadrant of the left breast.

On physical exam, there is bruising of the left breast at 1 o'clock
5 cm from the nipple. Thickening is felt in this area.

Targeted ultrasound is performed, showing a mixed echogenic,
predominantly hyperechoic area with cystic small cysts in the left
breast at 1 o'clock 5 cm from the nipple measuring 2.7 x 1.3 x
cm. This is likely posttraumatic change.
IMPRESSION: Probable posttraumatic changes in the left breast.

RECOMMENDATION:
Short-term interval follow-up left breast ultrasound 6 months is
recommended.

I have discussed the findings and recommendations with the patient.
Results were also provided in writing at the conclusion of the
visit. If applicable, a reminder letter will be sent to the patient
regarding the next appointment.

BI-RADS CATEGORY  3: Probably benign.

## 2019-05-12 ENCOUNTER — Encounter: Payer: Self-pay | Admitting: Gynecology
